# Patient Record
Sex: Male | Born: 1940 | Race: White | Hispanic: No | Marital: Married | Smoking: Current every day smoker
Health system: Southern US, Community
[De-identification: ages and names within clinical notes are randomized; demographics above are authoritative.]

## PROBLEM LIST (undated history)

## (undated) DIAGNOSIS — K589 Irritable bowel syndrome without diarrhea: Secondary | ICD-10-CM

## (undated) DIAGNOSIS — I10 Essential (primary) hypertension: Secondary | ICD-10-CM

## (undated) DIAGNOSIS — I4891 Unspecified atrial fibrillation: Secondary | ICD-10-CM

## (undated) DIAGNOSIS — N400 Enlarged prostate without lower urinary tract symptoms: Secondary | ICD-10-CM

## (undated) DIAGNOSIS — E785 Hyperlipidemia, unspecified: Secondary | ICD-10-CM

## (undated) DIAGNOSIS — K219 Gastro-esophageal reflux disease without esophagitis: Secondary | ICD-10-CM

## (undated) DIAGNOSIS — C4492 Squamous cell carcinoma of skin, unspecified: Secondary | ICD-10-CM

## (undated) DIAGNOSIS — F419 Anxiety disorder, unspecified: Secondary | ICD-10-CM

## (undated) HISTORY — DX: Squamous cell carcinoma of skin, unspecified: C44.92

---

## 2015-01-22 DIAGNOSIS — Z85828 Personal history of other malignant neoplasm of skin: Secondary | ICD-10-CM

## 2015-01-22 HISTORY — DX: Personal history of other malignant neoplasm of skin: Z85.828

## 2015-01-22 HISTORY — PX: BASAL CELL CARCINOMA EXCISION: SHX1214

## 2016-01-28 DIAGNOSIS — Z85828 Personal history of other malignant neoplasm of skin: Secondary | ICD-10-CM

## 2016-01-28 HISTORY — DX: Personal history of other malignant neoplasm of skin: Z85.828

## 2016-04-17 DIAGNOSIS — K588 Other irritable bowel syndrome: Secondary | ICD-10-CM | POA: Insufficient documentation

## 2016-04-21 ENCOUNTER — Encounter: Payer: Self-pay | Admitting: *Deleted

## 2016-04-21 ENCOUNTER — Emergency Department: Payer: Medicare Other

## 2016-04-21 ENCOUNTER — Emergency Department
Admission: EM | Admit: 2016-04-21 | Discharge: 2016-04-21 | Disposition: A | Discharge status: Home / Self Care | Payer: Medicare Other | Attending: Emergency Medicine | Admitting: Emergency Medicine

## 2016-04-21 DIAGNOSIS — R51 Headache: Secondary | ICD-10-CM | POA: Diagnosis not present

## 2016-04-21 DIAGNOSIS — Z87891 Personal history of nicotine dependence: Secondary | ICD-10-CM | POA: Insufficient documentation

## 2016-04-21 DIAGNOSIS — R42 Dizziness and giddiness: Secondary | ICD-10-CM | POA: Insufficient documentation

## 2016-04-21 DIAGNOSIS — R079 Chest pain, unspecified: Secondary | ICD-10-CM | POA: Diagnosis not present

## 2016-04-21 DIAGNOSIS — R197 Diarrhea, unspecified: Secondary | ICD-10-CM | POA: Diagnosis not present

## 2016-04-21 DIAGNOSIS — R110 Nausea: Secondary | ICD-10-CM | POA: Diagnosis not present

## 2016-04-21 DIAGNOSIS — Z79899 Other long term (current) drug therapy: Secondary | ICD-10-CM | POA: Diagnosis not present

## 2016-04-21 DIAGNOSIS — R531 Weakness: Secondary | ICD-10-CM | POA: Insufficient documentation

## 2016-04-21 DIAGNOSIS — R0602 Shortness of breath: Secondary | ICD-10-CM | POA: Insufficient documentation

## 2016-04-21 HISTORY — DX: Irritable bowel syndrome without diarrhea: K58.9

## 2016-04-21 HISTORY — DX: Unspecified atrial fibrillation: I48.91

## 2016-04-21 HISTORY — DX: Anxiety disorder, unspecified: F41.9

## 2016-04-21 LAB — URINALYSIS, COMPLETE (UACMP) WITH MICROSCOPIC
BILIRUBIN URINE: NEGATIVE
Bacteria, UA: NONE SEEN
Glucose, UA: NEGATIVE mg/dL
Hgb urine dipstick: NEGATIVE
Ketones, ur: 20 mg/dL — AB
Leukocytes, UA: NEGATIVE
Nitrite: NEGATIVE
PH: 6 (ref 5.0–8.0)
Protein, ur: 30 mg/dL — AB
SPECIFIC GRAVITY, URINE: 1.024 (ref 1.005–1.030)
SQUAMOUS EPITHELIAL / LPF: NONE SEEN

## 2016-04-21 LAB — BASIC METABOLIC PANEL
Anion gap: 10 (ref 5–15)
BUN: 19 mg/dL (ref 6–20)
CALCIUM: 9 mg/dL (ref 8.9–10.3)
CO2: 28 mmol/L (ref 22–32)
CREATININE: 1.27 mg/dL — AB (ref 0.61–1.24)
Chloride: 99 mmol/L — ABNORMAL LOW (ref 101–111)
GFR calc non Af Amer: 54 mL/min — ABNORMAL LOW (ref >60)
Glucose, Bld: 148 mg/dL — ABNORMAL HIGH (ref 65–99)
Potassium: 3.3 mmol/L — ABNORMAL LOW (ref 3.5–5.1)
SODIUM: 137 mmol/L (ref 135–145)

## 2016-04-21 LAB — CBC
HCT: 49.8 % (ref 40.0–52.0)
Hemoglobin: 16.4 g/dL (ref 13.0–18.0)
MCH: 31 pg (ref 26.0–34.0)
MCHC: 33 g/dL (ref 32.0–36.0)
MCV: 94.1 fL (ref 80.0–100.0)
PLATELETS: 243 10*3/uL (ref 150–440)
RBC: 5.29 MIL/uL (ref 4.40–5.90)
RDW: 13.3 % (ref 11.5–14.5)
WBC: 11.6 10*3/uL — ABNORMAL HIGH (ref 3.8–10.6)

## 2016-04-21 LAB — TROPONIN I: Troponin I: <0.03 ng/mL (ref <0.03)

## 2016-04-21 MED ORDER — IPRATROPIUM-ALBUTEROL 0.5-2.5 (3) MG/3ML IN SOLN
3.0000 mL | Freq: Once | RESPIRATORY_TRACT | Status: AC
  Administered 2016-04-21: 3 mL via RESPIRATORY_TRACT
  Filled 2016-04-21: qty 3

## 2016-04-21 MED ORDER — ALBUTEROL SULFATE HFA 108 (90 BASE) MCG/ACT IN AERS: 2.0000 | INHALATION_SPRAY | Freq: Four times a day (QID) | RESPIRATORY_TRACT | 0 refills | Status: DC | PRN

## 2016-04-21 NOTE — Discharge Instructions (Signed)
Please seek medical attention for any high fevers, chest pain, shortness of breath, change in behavior, persistent vomiting, bloody stool or any other new or concerning symptoms.  

## 2016-04-21 NOTE — ED Provider Notes (Signed)
[facility] Emergency Department Provider Note   ____________________________________________   I have reviewed the triage vital signs and the nursing notes.   HISTORY  Chief Complaint Weakness   History limited by: Not Limited   HPI Jeremiah Bell is a 76 y.o. male who presents to the emergency department today because of concerns for episode of chest pain, increasing shortness breath. Patient states that he has been having shortness of breath for the past couple of weeks. It is worse with exertion. The patient has a history of heavy smoking however states he is cutting back. He did not have any chest pain until today. Apparently also became diaphoretic. In addition however the patient has a history of anxiety and recently had his site a medication change. He had not taken it this morning. States that normally when he doesn't take his anxiety medication he does start shaking and that did happen this morning as well. He denies any fevers.    Past Medical History:  Diagnosis Date  . A-fib ([place])   . Anxiety   . Irritable bowel     There are no active problems to display for this patient.   History reviewed. No pertinent surgical history.  Prior to Admission medications   Medication Sig Start Date End Date Taking? Authorizing Provider  dicyclomine (BENTYL) 10 MG capsule Take 10 mg by mouth 4 (four) times daily as needed. 04/10/16  Yes Historical Provider, MD  flecainide (TAMBOCOR) 50 MG tablet Take 50 mg by mouth 2 (two) times daily. 04/09/16  Yes Historical Provider, MD  omeprazole (PRILOSEC) 40 MG capsule Take 40 mg by mouth daily. 04/10/16  Yes Historical Provider, MD  tamsulosin (FLOMAX) 0.4 MG CAPS capsule Take 0.4 mg by mouth 2 (two) times daily. 04/10/16  Yes Historical Provider, MD    Allergies Erythromycin and Sulfa antibiotics  History reviewed. No pertinent family history.  Social History Social History  Substance Use Topics  . Smoking  status: Never Smoker  . Smokeless tobacco: Never Used  . Alcohol use No    Review of Systems  Constitutional: Negative for fever. Cardiovascular: Positive for chest pain. Respiratory: Positive for shortness of breath. Gastrointestinal: Negative for abdominal pain, vomiting and diarrhea. Neurological: Negative for headaches, focal weakness or numbness.  10-point ROS otherwise negative.  ____________________________________________   PHYSICAL EXAM:  VITAL SIGNS: ED Triage Vitals [04/21/16 1210]  Enc Vitals Group     BP (!) 148/96     Pulse Rate 68     Resp 18     Temp 98.4 F (36.9 C)     Temp Source Oral     SpO2 100 %     Weight 202 lb (91.6 kg)     Height 5\' 11"  (1.803 m)     Head Circumference      Peak Flow      Pain Score 8   Constitutional: Alert and oriented. Well appearing and in no distress. Eyes: Conjunctivae are normal. Normal extraocular movements. ENT   Head: Normocephalic and atraumatic.   Nose: No congestion/rhinnorhea.   Mouth/Throat: Mucous membranes are moist.   Neck: No stridor. Hematological/Lymphatic/Immunilogical: No cervical lymphadenopathy. Cardiovascular: Normal rate, regular rhythm.  No murmurs, rubs, or gallops.  Respiratory: Normal respiratory effort without tachypnea nor retractions. Breath sounds are clear and equal bilaterally. No wheezes/rales/rhonchi. Gastrointestinal: Soft and non tender. No rebound. No guarding.  Genitourinary: Deferred Musculoskeletal: Normal range of motion in all extremities. No lower extremity edema. Neurologic:  Normal speech and language.  No gross focal neurologic deficits are appreciated.  Skin:  Skin is warm, dry and intact. No rash noted. Psychiatric: Mood and affect are normal. Speech and behavior are normal. Patient exhibits appropriate insight and judgment.  ____________________________________________    LABS (pertinent positives/negatives)  Labs Reviewed  BASIC METABOLIC PANEL -  Abnormal; Notable for the following:       Result Value   Potassium 3.3 (*)    Chloride 99 (*)    Glucose, Bld 148 (*)    Creatinine, Ser 1.27 (*)    GFR calc non Af Amer 54 (*)    All other components within normal limits  CBC - Abnormal; Notable for the following:    WBC 11.6 (*)    All other components within normal limits  URINALYSIS, COMPLETE (UACMP) WITH MICROSCOPIC - Abnormal; Notable for the following:    Color, Urine YELLOW (*)    APPearance CLEAR (*)    Ketones, ur 20 (*)    Protein, ur 30 (*)    All other components within normal limits  TROPONIN I  TROPONIN I  CBG MONITORING, ED     ____________________________________________   EKG  I, Nance Pear, attending physician, personally viewed and interpreted this EKG  EKG Time: 1202 Rate: 69 Rhythm: normal sinus rhythm Axis: normal Intervals: qtc 443 QRS: narrow ST changes: no st elevation Impression: normal ekg   ____________________________________________    RADIOLOGY  CXR IMPRESSION:  No acute disease.   ____________________________________________   PROCEDURES  Procedures  ____________________________________________   INITIAL IMPRESSION / ASSESSMENT AND PLAN / ED COURSE  Pertinent labs & imaging results that were available during my care of the patient were reviewed by me and considered in my medical decision making (see chart for details).  Patient presented to the emergency department today with episode of chest pain as well as shortness breath is been going on for the past couple of weeks. The patient had 2 troponins which were negative here. He also already has cardiology follow-up scheduled in 2 days. In addition he did get some relief from a DuoNeb. Does have a long history of smoking when he started manifest some COPD. Discussed return precautions.  ____________________________________________   FINAL CLINICAL IMPRESSION(S) / ED DIAGNOSES  Final diagnoses:  Shortness of  breath     Note: This dictation was prepared with Dragon dictation. Any transcriptional errors that result from this process are unintentional     Nance Pear, MD 04/21/16 1935

## 2016-04-21 NOTE — ED Notes (Signed)
Spoke with Dr. Jimmye Norman concerning pt, stated it would be okay for pt to take home xanax dose in lobby

## 2016-04-21 NOTE — ED Notes (Signed)
Patient transported to X-ray 

## 2016-04-21 NOTE — ED Triage Notes (Addendum)
Pt arrives with complaints of mid chest pain, headache, diarrhea and weakness for 3 days, pt sweating in triage, son with pt, pt shaking, states hx of anxiety and unsure if he has taken his xanax today, states 2 weeks ago they changed his xanax dosage, son yelled in lobby "he is in bad shape, if something happens to him I am going to sue"

## 2016-04-21 NOTE — ED Notes (Signed)
Pt presents with sob x 2 weeks, as well as sudden onset nausea, diarrhea x 3, dizziness, weakness today at 75 am. Son states that diarrhea is common for him.

## 2016-11-05 DIAGNOSIS — G4733 Obstructive sleep apnea (adult) (pediatric): Secondary | ICD-10-CM | POA: Insufficient documentation

## 2016-11-06 ENCOUNTER — Other Ambulatory Visit: Payer: Self-pay | Admitting: Cardiology

## 2016-11-06 DIAGNOSIS — R600 Localized edema: Secondary | ICD-10-CM

## 2016-11-10 ENCOUNTER — Ambulatory Visit
Admission: RE | Admit: 2016-11-10 | Discharge: 2016-11-10 | Disposition: A | Discharge status: Home / Self Care | Payer: Medicare Other | Source: Ambulatory Visit | Attending: Cardiology | Admitting: Cardiology

## 2016-11-10 DIAGNOSIS — R600 Localized edema: Secondary | ICD-10-CM | POA: Insufficient documentation

## 2017-12-14 ENCOUNTER — Telehealth: Payer: Self-pay | Admitting: *Deleted

## 2017-12-14 DIAGNOSIS — Z122 Encounter for screening for malignant neoplasm of respiratory organs: Secondary | ICD-10-CM

## 2017-12-14 DIAGNOSIS — Z87891 Personal history of nicotine dependence: Secondary | ICD-10-CM

## 2017-12-14 NOTE — Telephone Encounter (Signed)
Received referral for initial lung cancer screening scan. Contacted patient and obtained smoking history,(current, 57 pack year) as well as answering questions related to screening process. Patient denies signs of lung cancer such as weight loss or hemoptysis. Patient denies comorbidity that would prevent curative treatment if lung cancer were found. Patient is scheduled for shared decision making visit and CT scan on 12/31/17 at 145pm.

## 2017-12-30 ENCOUNTER — Telehealth: Payer: Self-pay | Admitting: *Deleted

## 2017-12-30 NOTE — Telephone Encounter (Signed)
Called pt to remind them of appt for tomorrow at 1345 for LDCT screening, message left.

## 2017-12-31 ENCOUNTER — Ambulatory Visit
Admission: RE | Admit: 2017-12-31 | Discharge: 2017-12-31 | Disposition: A | Discharge status: Home / Self Care | Payer: Medicare Other | Source: Ambulatory Visit | Attending: Nurse Practitioner | Admitting: Nurse Practitioner

## 2017-12-31 ENCOUNTER — Inpatient Hospital Stay: Payer: Medicare Other | Attending: Nurse Practitioner | Admitting: Nurse Practitioner

## 2017-12-31 ENCOUNTER — Encounter: Payer: Self-pay | Admitting: Nurse Practitioner

## 2017-12-31 DIAGNOSIS — Z87891 Personal history of nicotine dependence: Secondary | ICD-10-CM | POA: Insufficient documentation

## 2017-12-31 DIAGNOSIS — Z122 Encounter for screening for malignant neoplasm of respiratory organs: Secondary | ICD-10-CM | POA: Insufficient documentation

## 2017-12-31 NOTE — Progress Notes (Signed)
In accordance with CMS guidelines, patient has met eligibility criteria including age, absence of signs or symptoms of lung cancer.  Social History   Tobacco Use  . Smoking status: Current Every Day Smoker    Packs/day: 1.00    Years: 57.00    Pack years: 57.00  . Smokeless tobacco: Never Used  . Tobacco comment: currently smokes .5ppd  Substance Use Topics  . Alcohol use: No  . Drug use: Not on file      A shared decision-making session was conducted prior to the performance of CT scan. This includes one or more decision aids, includes benefits and harms of screening, follow-up diagnostic testing, over-diagnosis, false positive rate, and total radiation exposure.   Counseling on the importance of adherence to annual lung cancer LDCT screening, impact of co-morbidities, and ability or willingness to undergo diagnosis and treatment is imperative for compliance of the program.   Counseling on the importance of continued smoking cessation for former smokers; the importance of smoking cessation for current smokers, and information about tobacco cessation interventions have been given to patient including [place] and 1800 quit [place] programs.   Written order for lung cancer screening with LDCT has been given to the patient and any and all questions have been answered to the best of my abilities.    Yearly follow up will be coordinated by Burgess Estelle, Thoracic Navigator.  Beckey Rutter, DNP, AGNP-C [place] [facility] [phone] (work cell) [phone] (office)

## 2018-01-04 ENCOUNTER — Encounter: Payer: Self-pay | Admitting: *Deleted

## 2018-01-11 ENCOUNTER — Other Ambulatory Visit: Payer: Self-pay | Admitting: Internal Medicine

## 2018-01-11 DIAGNOSIS — R600 Localized edema: Secondary | ICD-10-CM

## 2018-01-11 DIAGNOSIS — R1031 Right lower quadrant pain: Secondary | ICD-10-CM

## 2018-01-20 ENCOUNTER — Ambulatory Visit
Admission: RE | Admit: 2018-01-20 | Discharge: 2018-01-20 | Disposition: A | Discharge status: Home / Self Care | Payer: Medicare Other | Source: Ambulatory Visit | Attending: Internal Medicine | Admitting: Internal Medicine

## 2018-01-20 DIAGNOSIS — R1031 Right lower quadrant pain: Secondary | ICD-10-CM

## 2018-01-20 DIAGNOSIS — R600 Localized edema: Secondary | ICD-10-CM | POA: Diagnosis present

## 2018-01-25 ENCOUNTER — Other Ambulatory Visit: Payer: Self-pay | Admitting: Internal Medicine

## 2018-01-25 ENCOUNTER — Encounter: Payer: Self-pay | Admitting: *Deleted

## 2018-01-25 DIAGNOSIS — N281 Cyst of kidney, acquired: Secondary | ICD-10-CM

## 2018-01-26 ENCOUNTER — Ambulatory Visit: Payer: Medicare Other | Admitting: Certified Registered Nurse Anesthetist

## 2018-01-26 ENCOUNTER — Encounter
Admission: RE | Disposition: A | Discharge status: Home / Self Care | Payer: Self-pay | Source: Home / Self Care | Attending: Internal Medicine

## 2018-01-26 ENCOUNTER — Encounter: Payer: Self-pay | Admitting: Certified Registered Nurse Anesthetist

## 2018-01-26 ENCOUNTER — Ambulatory Visit
Admission: RE | Admit: 2018-01-26 | Discharge: 2018-01-26 | Disposition: A | Discharge status: Home / Self Care | Payer: Medicare Other | Attending: Internal Medicine | Admitting: Internal Medicine

## 2018-01-26 DIAGNOSIS — K219 Gastro-esophageal reflux disease without esophagitis: Secondary | ICD-10-CM | POA: Diagnosis not present

## 2018-01-26 DIAGNOSIS — Z7901 Long term (current) use of anticoagulants: Secondary | ICD-10-CM | POA: Diagnosis not present

## 2018-01-26 DIAGNOSIS — E785 Hyperlipidemia, unspecified: Secondary | ICD-10-CM | POA: Diagnosis not present

## 2018-01-26 DIAGNOSIS — D122 Benign neoplasm of ascending colon: Secondary | ICD-10-CM | POA: Insufficient documentation

## 2018-01-26 DIAGNOSIS — Z09 Encounter for follow-up examination after completed treatment for conditions other than malignant neoplasm: Secondary | ICD-10-CM | POA: Insufficient documentation

## 2018-01-26 DIAGNOSIS — Z86010 Personal history of colonic polyps: Secondary | ICD-10-CM | POA: Insufficient documentation

## 2018-01-26 DIAGNOSIS — K589 Irritable bowel syndrome without diarrhea: Secondary | ICD-10-CM | POA: Insufficient documentation

## 2018-01-26 DIAGNOSIS — I4891 Unspecified atrial fibrillation: Secondary | ICD-10-CM | POA: Insufficient documentation

## 2018-01-26 DIAGNOSIS — Z79899 Other long term (current) drug therapy: Secondary | ICD-10-CM | POA: Insufficient documentation

## 2018-01-26 DIAGNOSIS — I10 Essential (primary) hypertension: Secondary | ICD-10-CM | POA: Insufficient documentation

## 2018-01-26 DIAGNOSIS — N400 Enlarged prostate without lower urinary tract symptoms: Secondary | ICD-10-CM | POA: Diagnosis not present

## 2018-01-26 DIAGNOSIS — D120 Benign neoplasm of cecum: Secondary | ICD-10-CM | POA: Insufficient documentation

## 2018-01-26 DIAGNOSIS — K640 First degree hemorrhoids: Secondary | ICD-10-CM | POA: Insufficient documentation

## 2018-01-26 DIAGNOSIS — F17200 Nicotine dependence, unspecified, uncomplicated: Secondary | ICD-10-CM | POA: Diagnosis not present

## 2018-01-26 DIAGNOSIS — F419 Anxiety disorder, unspecified: Secondary | ICD-10-CM | POA: Insufficient documentation

## 2018-01-26 HISTORY — DX: Gastro-esophageal reflux disease without esophagitis: K21.9

## 2018-01-26 HISTORY — DX: Benign prostatic hyperplasia without lower urinary tract symptoms: N40.0

## 2018-01-26 HISTORY — DX: Hyperlipidemia, unspecified: E78.5

## 2018-01-26 HISTORY — PX: COLONOSCOPY WITH PROPOFOL: SHX5780

## 2018-01-26 HISTORY — DX: Essential (primary) hypertension: I10

## 2018-01-26 SURGERY — COLONOSCOPY WITH PROPOFOL
Anesthesia: General

## 2018-01-26 MED ORDER — PROPOFOL 10 MG/ML IV BOLUS
INTRAVENOUS | Status: DC | PRN
  Administered 2018-01-26: 30 mg via INTRAVENOUS
  Administered 2018-01-26: 70 mg via INTRAVENOUS

## 2018-01-26 MED ORDER — LIDOCAINE HCL (CARDIAC) PF 100 MG/5ML IV SOSY
PREFILLED_SYRINGE | INTRAVENOUS | Status: DC | PRN
  Administered 2018-01-26: 50 mg via INTRAVENOUS

## 2018-01-26 MED ORDER — PROPOFOL 500 MG/50ML IV EMUL
INTRAVENOUS | Status: AC
  Filled 2018-01-26: qty 50

## 2018-01-26 MED ORDER — SODIUM CHLORIDE 0.9 % IV SOLN
INTRAVENOUS | Status: DC
  Administered 2018-01-26: 1000 mL via INTRAVENOUS

## 2018-01-26 MED ORDER — PROPOFOL 500 MG/50ML IV EMUL
INTRAVENOUS | Status: DC | PRN
  Administered 2018-01-26: 175 ug/kg/min via INTRAVENOUS

## 2018-01-26 MED ORDER — PHENYLEPHRINE HCL 10 MG/ML IJ SOLN
INTRAMUSCULAR | Status: DC | PRN
  Administered 2018-01-26: 200 ug via INTRAVENOUS

## 2018-01-26 NOTE — Interval H&P Note (Signed)
History and Physical Interval Note:  01/26/2018 10:47 AM  Jeremiah Bell  has presented today for surgery, with the diagnosis of PERSONAL HX.OF COLON POLYPS  The various methods of treatment have been discussed with the patient and family. After consideration of risks, benefits and other options for treatment, the patient has consented to  Procedure(s): COLONOSCOPY WITH PROPOFOL (N/A) as a surgical intervention .  The patient's history has been reviewed, patient examined, no change in status, stable for surgery.  I have reviewed the patient's chart and labs.  Questions were answered to the patient's satisfaction.     [place], [place]

## 2018-01-26 NOTE — Anesthesia Post-op Follow-up Note (Signed)
Anesthesia QCDR form completed.        

## 2018-01-26 NOTE — Anesthesia Procedure Notes (Signed)
Date/Time: 01/26/2018 11:02 AM Performed by: Johnna Acosta, CRNA Pre-anesthesia Checklist: Patient identified, Emergency Drugs available, Suction available, Patient being monitored and Timeout performed Patient Re-evaluated:Patient Re-evaluated prior to induction Oxygen Delivery Method: Nasal cannula Preoxygenation: Pre-oxygenation with 100% oxygen Induction Type: IV induction

## 2018-01-26 NOTE — Anesthesia Preprocedure Evaluation (Signed)
Anesthesia Evaluation  Patient identified by MRN, date of birth, ID band Patient awake    Reviewed: Allergy & Precautions, H&P , NPO status , Patient's Chart, lab work & pertinent test results, reviewed documented beta blocker date and time   Airway Mallampati: II   Neck ROM: full    Dental  (+) Poor Dentition, Teeth Intact   Pulmonary neg pulmonary ROS, Current Smoker,    Pulmonary exam normal        Cardiovascular Exercise Tolerance: Poor hypertension, On Medications negative cardio ROS Normal cardiovascular exam Rhythm:regular Rate:Normal     Neuro/Psych Anxiety negative neurological ROS  negative psych ROS   GI/Hepatic Neg liver ROS, GERD  Medicated,  Endo/Other  negative endocrine ROS  Renal/GU negative Renal ROS  negative genitourinary   Musculoskeletal   Abdominal   Peds  Hematology negative hematology ROS (+)   Anesthesia Other Findings Past Medical History: No date: A-fib ([place]) No date: Anxiety No date: BPH (benign prostatic hyperplasia) No date: GERD (gastroesophageal reflux disease) No date: Hyperlipidemia No date: Hypertension No date: Irritable bowel History reviewed. No pertinent surgical history. BMI    Body Mass Index:  29.01 kg/m     Reproductive/Obstetrics negative OB ROS                             Anesthesia Physical Anesthesia Plan  ASA: III  Anesthesia Plan: General   Post-op Pain Management:    Induction:   PONV Risk Score and Plan:   Airway Management Planned:   Additional Equipment:   Intra-op Plan:   Post-operative Plan:   Informed Consent: I have reviewed the patients History and Physical, chart, labs and discussed the procedure including the risks, benefits and alternatives for the proposed anesthesia with the patient or authorized representative who has indicated his/her understanding and acceptance.     Dental Advisory  Given  Plan Discussed with: CRNA  Anesthesia Plan Comments:         Anesthesia Quick Evaluation

## 2018-01-26 NOTE — Transfer of Care (Signed)
Immediate Anesthesia Transfer of Care Note  Patient: Jeremiah Bell  Procedure(s) Performed: COLONOSCOPY WITH PROPOFOL (N/A )  Patient Location: Endoscopy Unit  Anesthesia Type:General  Level of Consciousness: drowsy and patient cooperative  Airway & Oxygen Therapy: Patient Spontanous Breathing and Patient connected to nasal cannula oxygen  Post-op Assessment: Report given to RN and Post -op Vital signs reviewed and stable  Post vital signs: Reviewed  Last Vitals:  Vitals Value Taken Time  BP    Temp    Pulse 62 01/26/2018 11:27 AM  Resp 16 01/26/2018 11:27 AM  SpO2      Last Pain:  Vitals:   01/26/18 0949  TempSrc: Tympanic  PainSc: 0-No pain         Complications: No apparent anesthesia complications

## 2018-01-26 NOTE — Op Note (Signed)
[facility] Gastroenterology Patient Name: Jeremiah Bell Procedure Date: 01/26/2018 10:51 AM MRN: [id number] Account #: [id number] Date of Birth: [birth year] Admit Type: Outpatient Age: 78 Room: [facility] ENDO ROOM 2 Gender: Male Note Status: Finalized Procedure:            Colonoscopy Indications:          High risk colon cancer surveillance: Personal history                        of colonic polyps Providers:            Benay Pike. Alice Reichert MD, MD Referring MD:         Tracie Harrier, MD (Referring MD) Medicines:            Propofol per Anesthesia Complications:        No immediate complications. Procedure:            Pre-Anesthesia Assessment:                       - The risks and benefits of the procedure and the                        sedation options and risks were discussed with the                        patient. All questions were answered and informed                        consent was obtained.                       - Patient identification and proposed procedure were                        verified prior to the procedure by the nurse. The                        procedure was verified in the procedure room.                       - ASA Grade Assessment: III - A patient with severe                        systemic disease.                       - After reviewing the risks and benefits, the patient                        was deemed in satisfactory condition to undergo the                        procedure.                       After obtaining informed consent, the colonoscope was                        passed under direct vision. Throughout the procedure,  the patient's blood pressure, pulse, and oxygen                        saturations were monitored continuously. The                        Colonoscope was introduced through the anus and                        advanced to the the cecum, identified by appendiceal   orifice and ileocecal valve. The colonoscopy was                        performed without difficulty. The patient tolerated the                        procedure well. The quality of the bowel preparation                        was excellent. The ileocecal valve, appendiceal                        orifice, and rectum were photographed. Findings:      The perianal and digital rectal examinations were normal. Pertinent       negatives include normal sphincter tone and no palpable rectal lesions.      Five sessile polyps were found in the ascending colon and cecum. The       polyps were 5 to 8 mm in size. These polyps were removed with a cold       snare. Resection and retrieval were complete.      A 4 mm polyp was found in the ascending colon. The polyp was sessile.       The polyp was removed with a jumbo cold forceps. Resection and retrieval       were complete.      Non-bleeding internal hemorrhoids were found during retroflexion. The       hemorrhoids were Grade I (internal hemorrhoids that do not prolapse).      The exam was otherwise without abnormality. Impression:           - Five 5 to 8 mm polyps in the ascending colon and in                        the cecum, removed with a cold snare. Resected and                        retrieved.                       - One 4 mm polyp in the ascending colon, removed with a                        jumbo cold forceps. Resected and retrieved.                       - Non-bleeding internal hemorrhoids.                       - The examination was otherwise normal. Recommendation:       - Patient has a contact number available  for                        emergencies. The signs and symptoms of potential                        delayed complications were discussed with the patient.                        Return to normal activities tomorrow. Written discharge                        instructions were provided to the patient.                       - Resume  previous diet.                       - Continue present medications.                       - No repeat colonoscopy due to age.                       - Return to GI office.                       - Return to GI office PRN. Procedure Code(s):    --- Professional ---                       [id number], Colonoscopy, flexible; with removal of tumor(s),                        polyp(s), or other lesion(s) by snare technique                       45380, 34, Colonoscopy, flexible; with biopsy, single                        or multiple Diagnosis Code(s):    --- Professional ---                       K64.0, First degree hemorrhoids                       D12.2, Benign neoplasm of ascending colon                       D12.0, Benign neoplasm of cecum                       Z86.010, Personal history of colonic polyps CPT copyright 2018 American Medical Association. All rights reserved. The codes documented in this report are preliminary and upon coder review may  be revised to meet current compliance requirements. Efrain Sella MD, MD 01/26/2018 11:26:37 AM This report has been signed electronically. Number of Addenda: 0 Note Initiated On: 01/26/2018 10:51 AM Scope Withdrawal Time: 0 hours 13 minutes 21 seconds  Total Procedure Duration: 0 hours 15 minutes 39 seconds       [facility]

## 2018-01-26 NOTE — H&P (Signed)
Outpatient short stay form Pre-procedure 01/26/2018 10:45 AM Jeremiah Bell K. Alice Reichert, M.D.  Primary Physician: Tracie Harrier, M.D.  Reason for visit:  Personal hx of colon polyps  History of present illness:                            Patient presents for colonoscopy for a personal hx of colon polyps. The patient denies abdominal pain, abnormal weight loss or rectal bleeding. Held warfarin x 5 days prior to procedure which he takes for atrial fibrillation.    Current Facility-Administered Medications:  .  0.9 %  sodium chloride infusion, , Intravenous, Continuous, [place], Benay Pike, MD, Last Rate: 20 mL/hr at 01/26/18 1001  Medications Prior to Admission  Medication Sig Dispense Refill Last Dose  . albuterol (PROVENTIL HFA;VENTOLIN HFA) 108 (90 Base) MCG/ACT inhaler Inhale 2 puffs into the lungs every 6 (six) hours as needed for wheezing or shortness of breath. 1 Inhaler 0 Past Week at Unknown time  . ALPRAZolam (XANAX) 1 MG tablet Take 1 mg by mouth 2 (two) times daily as needed for anxiety.   Past Week at Unknown time  . dicyclomine (BENTYL) 10 MG capsule Take 10 mg by mouth 4 (four) times daily as needed.  0 Past Week at Unknown time  . flecainide (TAMBOCOR) 50 MG tablet Take 50 mg by mouth 2 (two) times daily.  2 Past Week at Unknown time  . omeprazole (PRILOSEC) 40 MG capsule Take 40 mg by mouth daily.  0 Past Week at Unknown time  . senna-docusate (SENOKOT-S) 8.6-50 MG tablet Take 1 tablet by mouth 2 (two) times daily.   Past Week at Unknown time  . simvastatin (ZOCOR) 40 MG tablet Take 40 mg by mouth daily.   Past Week at Unknown time  . tamsulosin (FLOMAX) 0.4 MG CAPS capsule Take 0.4 mg by mouth 2 (two) times daily.  0 01/25/2018 at Unknown time  . torsemide (DEMADEX) 10 MG tablet Take 30 mg by mouth daily.   Past Week at Unknown time  . traZODone (DESYREL) 50 MG tablet Take 50 mg by mouth at bedtime.   Past Week at Unknown time  . vitamin B-12 (CYANOCOBALAMIN) 1000 MCG tablet Take  1,000 mcg by mouth daily.   Past Week at Unknown time  . warfarin (COUMADIN) 1 MG tablet Take 1 mg by mouth daily.   Past Week at Unknown time  . warfarin (COUMADIN) 5 MG tablet Take 5 mg by mouth daily.   Past Week at Unknown time     Allergies  Allergen Reactions  . Erythromycin   . Sulfa Antibiotics   . Terramycin [Oxytetracycline] Other (See Comments)     Past Medical History:  Diagnosis Date  . A-fib ([place])   . Anxiety   . BPH (benign prostatic hyperplasia)   . GERD (gastroesophageal reflux disease)   . Hyperlipidemia   . Hypertension   . Irritable bowel     Review of systems:  Otherwise negative.    Physical Exam  Gen: Alert, oriented. Appears stated age.  HEENT: [place]/AT. PERRLA. Lungs: CTA, no wheezes. CV: RR nl S1, S2. Abd: soft, benign, no masses. BS+ Ext: No edema. Pulses 2+    Planned procedures: Proceed with colonoscopy. The patient understands the nature of the planned procedure, indications, risks, alternatives and potential complications including but not limited to bleeding, infection, perforation, damage to internal organs and possible oversedation/side effects from anesthesia. The patient agrees and gives consent  to proceed.  Please refer to procedure notes for findings, recommendations and patient disposition/instructions.     Teagyn Fishel K. Alice Reichert, M.D. Gastroenterology 01/26/2018  10:45 AM

## 2018-01-26 NOTE — Anesthesia Postprocedure Evaluation (Signed)
Anesthesia Post Note  Patient: Jeremiah Bell  Procedure(s) Performed: COLONOSCOPY WITH PROPOFOL (N/A )  Patient location during evaluation: Endoscopy Anesthesia Type: General Level of consciousness: awake and alert and oriented Pain management: pain level controlled Vital Signs Assessment: post-procedure vital signs reviewed and stable Respiratory status: spontaneous breathing, nonlabored ventilation and respiratory function stable Cardiovascular status: blood pressure returned to baseline and stable Postop Assessment: no signs of nausea or vomiting Anesthetic complications: no     Last Vitals:  Vitals:   01/26/18 1146 01/26/18 1156  BP: 97/68 120/69  Pulse: (!) 53 73  Resp: 14 14  Temp:    SpO2: 98% 98%    Last Pain:  Vitals:   01/26/18 1156  TempSrc:   PainSc: 0-No pain                 Gala Padovano

## 2018-01-27 ENCOUNTER — Encounter: Payer: Self-pay | Admitting: Internal Medicine

## 2018-01-27 LAB — SURGICAL PATHOLOGY

## 2018-02-01 ENCOUNTER — Ambulatory Visit
Admission: RE | Admit: 2018-02-01 | Discharge: 2018-02-01 | Disposition: A | Discharge status: Home / Self Care | Payer: Medicare Other | Source: Ambulatory Visit | Attending: Internal Medicine | Admitting: Internal Medicine

## 2018-02-01 DIAGNOSIS — N281 Cyst of kidney, acquired: Secondary | ICD-10-CM

## 2018-02-01 MED ORDER — IOPAMIDOL (ISOVUE-300) INJECTION 61%
100.0000 mL | Freq: Once | INTRAVENOUS | Status: AC | PRN
  Administered 2018-02-01: 100 mL via INTRAVENOUS

## 2018-03-12 ENCOUNTER — Other Ambulatory Visit: Payer: Self-pay | Admitting: Internal Medicine

## 2018-03-12 DIAGNOSIS — R911 Solitary pulmonary nodule: Secondary | ICD-10-CM

## 2018-03-16 ENCOUNTER — Emergency Department
Admission: EM | Admit: 2018-03-16 | Discharge: 2018-03-17 | Disposition: A | Discharge status: Home / Self Care | Payer: Medicare Other | Attending: Emergency Medicine | Admitting: Emergency Medicine

## 2018-03-16 ENCOUNTER — Other Ambulatory Visit: Payer: Self-pay

## 2018-03-16 ENCOUNTER — Emergency Department: Payer: Medicare Other

## 2018-03-16 DIAGNOSIS — E876 Hypokalemia: Secondary | ICD-10-CM | POA: Diagnosis not present

## 2018-03-16 DIAGNOSIS — R202 Paresthesia of skin: Secondary | ICD-10-CM | POA: Diagnosis present

## 2018-03-16 DIAGNOSIS — Z79899 Other long term (current) drug therapy: Secondary | ICD-10-CM | POA: Diagnosis not present

## 2018-03-16 DIAGNOSIS — Z7901 Long term (current) use of anticoagulants: Secondary | ICD-10-CM | POA: Diagnosis not present

## 2018-03-16 DIAGNOSIS — I10 Essential (primary) hypertension: Secondary | ICD-10-CM | POA: Diagnosis not present

## 2018-03-16 DIAGNOSIS — F17210 Nicotine dependence, cigarettes, uncomplicated: Secondary | ICD-10-CM | POA: Insufficient documentation

## 2018-03-16 LAB — CBC
HCT: 47.9 % (ref 39.0–52.0)
Hemoglobin: 16.8 g/dL (ref 13.0–17.0)
MCH: 32 pg (ref 26.0–34.0)
MCHC: 35.1 g/dL (ref 30.0–36.0)
MCV: 91.2 fL (ref 80.0–100.0)
Platelets: 275 10*3/uL (ref 150–400)
RBC: 5.25 MIL/uL (ref 4.22–5.81)
RDW: 11.9 % (ref 11.5–15.5)
WBC: 11.7 10*3/uL — ABNORMAL HIGH (ref 4.0–10.5)
nRBC: 0 % (ref 0.0–0.2)

## 2018-03-16 LAB — COMPREHENSIVE METABOLIC PANEL
ALT: 22 U/L (ref 0–44)
AST: 25 U/L (ref 15–41)
Albumin: 4.3 g/dL (ref 3.5–5.0)
Alkaline Phosphatase: 44 U/L (ref 38–126)
Anion gap: 13 (ref 5–15)
BUN: 22 mg/dL (ref 8–23)
CO2: 27 mmol/L (ref 22–32)
Calcium: 9.2 mg/dL (ref 8.9–10.3)
Chloride: 98 mmol/L (ref 98–111)
Creatinine, Ser: 1.5 mg/dL — ABNORMAL HIGH (ref 0.61–1.24)
GFR calc Af Amer: 52 mL/min — ABNORMAL LOW (ref >60)
GFR calc non Af Amer: 45 mL/min — ABNORMAL LOW (ref >60)
Glucose, Bld: 171 mg/dL — ABNORMAL HIGH (ref 70–99)
Potassium: 2.5 mmol/L — CL (ref 3.5–5.1)
Sodium: 138 mmol/L (ref 135–145)
Total Bilirubin: 0.7 mg/dL (ref 0.3–1.2)
Total Protein: 7.2 g/dL (ref 6.5–8.1)

## 2018-03-16 LAB — TROPONIN I

## 2018-03-16 LAB — PROTIME-INR
INR: 1.4 — ABNORMAL HIGH (ref 0.8–1.2)
Prothrombin Time: 17.1 seconds — ABNORMAL HIGH (ref 11.4–15.2)

## 2018-03-16 NOTE — ED Triage Notes (Addendum)
Pt in with co tingling to all extremities states has had it for a week states tonight became worse to lower legs bilat. States also co shob and congestion for over a week.

## 2018-03-16 NOTE — ED Provider Notes (Addendum)
[facility] Emergency Department Provider Note    First MD Initiated Contact with Patient 03/16/18 2336     (approximate)  I have reviewed the triage vital signs and the nursing notes.   HISTORY  Chief Complaint Tingling   HPI Jeremiah Bell is a 78 y.o. male with below list of chronic medical conditions including hypertension hyperlipidemia atrial fibrillation presents to the emergency department with 1-week history of tingling and discomfort to all extremities.  Patient denies any chest pain no shortness of breath.  Patient denies any fever.  Patient states that tingling and discomfort has progressively worsened since onset.       Past Medical History:  Diagnosis Date  . A-fib ([place])   . Anxiety   . BPH (benign prostatic hyperplasia)   . GERD (gastroesophageal reflux disease)   . Hyperlipidemia   . Hypertension   . Irritable bowel     Patient Active Problem List   Diagnosis Date Noted  . Personal history of tobacco use, presenting hazards to health 12/31/2017    Past Surgical History:  Procedure Laterality Date  . COLONOSCOPY WITH PROPOFOL N/A 01/26/2018   Procedure: COLONOSCOPY WITH PROPOFOL;  Surgeon: Toledo, Benay Pike, MD;  Location: [facility] ENDOSCOPY;  Service: Gastroenterology;  Laterality: N/A;    Prior to Admission medications   Medication Sig Start Date End Date Taking? Authorizing Provider  albuterol (PROVENTIL HFA;VENTOLIN HFA) 108 (90 Base) MCG/ACT inhaler Inhale 2 puffs into the lungs every 6 (six) hours as needed for wheezing or shortness of breath. 04/21/16   Nance Pear, MD  ALPRAZolam Duanne Moron) 1 MG tablet Take 1 mg by mouth 2 (two) times daily as needed for anxiety.    [provider]  dicyclomine (BENTYL) 10 MG capsule Take 10 mg by mouth 4 (four) times daily as needed. 04/10/16   [provider]  flecainide (TAMBOCOR) 50 MG tablet Take 50 mg by mouth 2 (two) times daily. 04/09/16   [provider]   omeprazole (PRILOSEC) 40 MG capsule Take 40 mg by mouth daily. 04/10/16   [provider]  senna-docusate (SENOKOT-S) 8.6-50 MG tablet Take 1 tablet by mouth 2 (two) times daily.    [provider]  simvastatin (ZOCOR) 40 MG tablet Take 40 mg by mouth daily.    [provider]  tamsulosin (FLOMAX) 0.4 MG CAPS capsule Take 0.4 mg by mouth 2 (two) times daily. 04/10/16   [provider]  torsemide (DEMADEX) 10 MG tablet Take 30 mg by mouth daily.    [provider]  traZODone (DESYREL) 50 MG tablet Take 50 mg by mouth at bedtime.    [provider]  vitamin B-12 (CYANOCOBALAMIN) 1000 MCG tablet Take 1,000 mcg by mouth daily.    [provider]  warfarin (COUMADIN) 1 MG tablet Take 1 mg by mouth daily.    [provider]  warfarin (COUMADIN) 5 MG tablet Take 5 mg by mouth daily.    [provider]    Allergies Erythromycin; Sulfa antibiotics; and Terramycin [oxytetracycline]  No family history on file.  Social History Social History   Tobacco Use  . Smoking status: Current Every Day Smoker    Packs/day: 1.00    Years: 57.00    Pack years: 57.00    Types: Cigarettes  . Smokeless tobacco: Never Used  . Tobacco comment: currently smokes .5ppd  Substance Use Topics  . Alcohol use: Yes    Alcohol/week: 5.0 standard drinks  Types: 2 Glasses of wine, 3 Cans of beer per week    Comment: daily  . Drug use: Never    Review of Systems Constitutional: No fever/chills Eyes: No visual changes. ENT: No sore throat. Cardiovascular: Denies chest pain. Respiratory: Denies shortness of breath. Gastrointestinal: No abdominal pain.  No nausea, no vomiting.  No diarrhea.  No constipation. Genitourinary: Negative for dysuria. Musculoskeletal: Negative for neck pain.  Negative for back pain. Integumentary: Negative for rash. Neurological: Negative for headaches, focal weakness or numbness.  Positive for tingling to  all extremities   ____________________________________________   PHYSICAL EXAM:  VITAL SIGNS: ED Triage Vitals  Enc Vitals Group     BP 03/16/18 2129 130/85     Pulse Rate 03/16/18 2129 70     Resp 03/16/18 2129 (!) 24     Temp 03/16/18 2129 (!) 97.4 F (36.3 C)     Temp Source 03/16/18 2129 Oral     SpO2 03/16/18 2129 99 %     Weight 03/16/18 2130 9.526 kg (21 lb)     Height 03/16/18 2130 1.803 m (5\' 11" )     Head Circumference --      Peak Flow --      Pain Score 03/16/18 2130 8     Pain Loc --      Pain Edu? --      Excl. in [place]? --     Constitutional: Alert and oriented. Well appearing and in no acute distress. Eyes: Conjunctivae are normal. PERRL. EOMI. Mouth/Throat: Mucous membranes are moist. Oropharynx non-erythematous. Neck: No stridor. Cardiovascular: Normal rate, regular rhythm. Good peripheral circulation. Grossly normal heart sounds. Respiratory: Normal respiratory effort.  No retractions. Lungs CTAB. Gastrointestinal: Soft and nontender. No distention.  Musculoskeletal: No lower extremity tenderness nor edema. No gross deformities of extremities. Neurologic:  Normal speech and language. No gross focal neurologic deficits are appreciated.  Skin:  Skin is warm, dry and intact. No rash noted. Psychiatric: Mood and affect are normal. Speech and behavior are normal.  ____________________________________________   LABS (all labs ordered are listed, but only abnormal results are displayed)  Labs Reviewed  CBC - Abnormal; Notable for the following components:      Result Value   WBC 11.7 (*)    All other components within normal limits  COMPREHENSIVE METABOLIC PANEL - Abnormal; Notable for the following components:   Potassium 2.5 (*)    Glucose, Bld 171 (*)    Creatinine, Ser 1.50 (*)    GFR calc non Af Amer 45 (*)    GFR calc Af Amer 52 (*)    All other components within normal limits  PROTIME-INR - Abnormal; Notable for the following components:    Prothrombin Time 17.1 (*)    INR 1.4 (*)    All other components within normal limits  TROPONIN I   ____________________________________________  EKG  ED ECG REPORT I, [place] N , the attending physician, personally viewed and interpreted this ECG.   Date: 03/17/2018  EKG Time: 9:28 PM  Rate: 102  Rhythm: Atrial fibrillation rapid ventricular response  Axis: Normal  Intervals: Normal  ST&T Change: None  ____________________________________________  RADIOLOGY I, [place] Ernst Bowler, personally viewed and evaluated these images (plain radiographs) as part of my medical decision making, as well as reviewing the written report by the radiologist.  ED MD interpretation: No acute cardiopulmonary process noted on chest x-ray per radiologist.  Official radiology report(s): Dg Chest 2 View  Result Date: 03/16/2018 CLINICAL DATA:  Extremity tingling, shortness of breath and congestion for a week. EXAM: CHEST - 2 VIEW COMPARISON:  CT chest December 31, 2017 FINDINGS: Cardiomediastinal silhouette is normal. No pleural effusions or focal consolidations. Trachea projects midline and there is no pneumothorax. Soft tissue planes and included osseous structures are non-suspicious. Mild thoracic spondylosis. IMPRESSION: No acute cardiopulmonary process. Electronically Signed   By: Elon Alas M.D.   On: 03/16/2018 21:53     Procedures   ____________________________________________   INITIAL IMPRESSION / MDM / [place] / ED COURSE  As part of my medical decision making, I reviewed the following data within the electronic MEDICAL RECORD NUMBER  78 year old male presented with above-stated history and physical exam concerning for possible hypokalemia given history of furosemide.  Laboratory data revealed potassium of 2.5.  Patient given 40 mEq of potassium p.o. as well as 10 mEq of IV potassium x3 doses ordered.  _____________________________  FINAL CLINICAL IMPRESSION(S) / ED  DIAGNOSES  Final diagnoses:  None     MEDICATIONS GIVEN DURING THIS VISIT:  Medications - No data to display   ED Discharge Orders    None       Note:  This document was prepared using Dragon voice recognition software and may include unintentional dictation errors.   Gregor Hams, MD 03/17/18 [id number]    Gregor Hams, MD 03/17/18 [phone]

## 2018-03-17 DIAGNOSIS — E876 Hypokalemia: Secondary | ICD-10-CM | POA: Diagnosis not present

## 2018-03-17 MED ORDER — POTASSIUM CHLORIDE CRYS ER 20 MEQ PO TBCR: 40.0000 meq | EXTENDED_RELEASE_TABLET | Freq: Once | ORAL | Status: DC

## 2018-03-17 MED ORDER — POTASSIUM CHLORIDE CRYS ER 20 MEQ PO TBCR
40.0000 meq | EXTENDED_RELEASE_TABLET | Freq: Once | ORAL | Status: AC
  Administered 2018-03-17: 40 meq via ORAL
  Filled 2018-03-17: qty 2

## 2018-03-17 MED ORDER — POTASSIUM CHLORIDE 10 MEQ/100ML IV SOLN
10.0000 meq | INTRAVENOUS | Status: AC
  Administered 2018-03-17 (×3): 10 meq via INTRAVENOUS
  Filled 2018-03-17 (×3): qty 100

## 2018-04-02 ENCOUNTER — Other Ambulatory Visit: Payer: Self-pay

## 2018-04-02 ENCOUNTER — Encounter (INDEPENDENT_AMBULATORY_CARE_PROVIDER_SITE_OTHER): Payer: Self-pay | Admitting: Vascular Surgery

## 2018-04-02 ENCOUNTER — Other Ambulatory Visit (INDEPENDENT_AMBULATORY_CARE_PROVIDER_SITE_OTHER): Payer: Self-pay | Admitting: Vascular Surgery

## 2018-04-02 ENCOUNTER — Ambulatory Visit (INDEPENDENT_AMBULATORY_CARE_PROVIDER_SITE_OTHER): Payer: Medicare Other | Admitting: Vascular Surgery

## 2018-04-02 ENCOUNTER — Ambulatory Visit (INDEPENDENT_AMBULATORY_CARE_PROVIDER_SITE_OTHER): Payer: Medicare Other

## 2018-04-02 VITALS — BP 168/83 | HR 74 | Resp 16 | Ht 71.0 in | Wt 199.0 lb

## 2018-04-02 DIAGNOSIS — I739 Peripheral vascular disease, unspecified: Secondary | ICD-10-CM | POA: Diagnosis not present

## 2018-04-02 DIAGNOSIS — F17210 Nicotine dependence, cigarettes, uncomplicated: Secondary | ICD-10-CM

## 2018-04-02 DIAGNOSIS — I4891 Unspecified atrial fibrillation: Secondary | ICD-10-CM

## 2018-04-02 DIAGNOSIS — M79604 Pain in right leg: Secondary | ICD-10-CM | POA: Diagnosis not present

## 2018-04-02 DIAGNOSIS — E785 Hyperlipidemia, unspecified: Secondary | ICD-10-CM | POA: Diagnosis not present

## 2018-04-02 DIAGNOSIS — Z7901 Long term (current) use of anticoagulants: Secondary | ICD-10-CM

## 2018-04-02 DIAGNOSIS — I10 Essential (primary) hypertension: Secondary | ICD-10-CM

## 2018-04-02 DIAGNOSIS — M79605 Pain in left leg: Secondary | ICD-10-CM

## 2018-04-02 DIAGNOSIS — Z79899 Other long term (current) drug therapy: Secondary | ICD-10-CM

## 2018-04-02 DIAGNOSIS — M79609 Pain in unspecified limb: Secondary | ICD-10-CM | POA: Insufficient documentation

## 2018-04-02 NOTE — Patient Instructions (Signed)
Peripheral Vascular Disease  Peripheral vascular disease (PVD) is a disease of the blood vessels that are not part of your heart and brain. A simple term for PVD is poor circulation. In most cases, PVD narrows the blood vessels that carry blood from your heart to the rest of your body. This can reduce the supply of blood to your arms, legs, and internal organs, like your stomach or kidneys. However, PVD most often affects a person's lower legs and feet. Without treatment, PVD tends to get worse. PVD can also lead to acute ischemic limb. This is when an arm or leg suddenly cannot get enough blood. This is a medical emergency. Follow these instructions at home: Lifestyle  Do not use any products that contain nicotine or tobacco, such as cigarettes and e-cigarettes. If you need help quitting, ask your doctor.  Lose weight if you are overweight. Or, stay at a healthy weight as told by your doctor.  Eat a diet that is low in fat and cholesterol. If you need help, ask your doctor.  Exercise regularly. Ask your doctor for activities that are right for you. General instructions  Take over-the-counter and prescription medicines only as told by your doctor.  Take good care of your feet: ? Wear comfortable shoes that fit well. ? Check your feet often for any cuts or sores.  Keep all follow-up visits as told by your doctor This is important. Contact a doctor if:  You have cramps in your legs when you walk.  You have leg pain when you are at rest.  You have coldness in a leg or foot.  Your skin changes.  You are unable to get or have an erection (erectile dysfunction).  You have cuts or sores on your feet that do not heal. Get help right away if:  Your arm or leg turns cold, numb, and blue.  Your arms or legs become red, warm, swollen, painful, or numb.  You have chest pain.  You have trouble breathing.  You suddenly have weakness in your face, arm, or leg.  You become very  confused or you cannot speak.  You suddenly have a very bad headache.  You suddenly cannot see. Summary  Peripheral vascular disease (PVD) is a disease of the blood vessels.  A simple term for PVD is poor circulation. Without treatment, PVD tends to get worse.  Treatment may include exercise, low fat and low cholesterol diet, and quitting smoking. This information is not intended to replace advice given to you by your health care provider. Make sure you discuss any questions you have with your health care provider. Document Released: 03/26/2009 Document Revised: 02/07/2016 Document Reviewed: 02/07/2016 Elsevier Interactive Patient Education  2019 Elsevier Inc.  

## 2018-04-02 NOTE — Assessment & Plan Note (Signed)
blood pressure control important in reducing the progression of atherosclerotic disease. On appropriate oral medications.  

## 2018-04-02 NOTE — Assessment & Plan Note (Signed)
He is studied with noninvasive studies today which demonstrate normal triphasic waveforms and ABIs of 1.1 bilaterally with normal digital pressures consistent with no arterial insufficiency.  It does not appear that his lower extremity symptoms are related to arterial insufficiency.  No further vascular testing or evaluation is necessary at this time.  A higher dose of Neurontin may be of benefit.  I will see him back as needed.

## 2018-04-02 NOTE — Assessment & Plan Note (Signed)
lipid control important in reducing the progression of atherosclerotic disease. Continue statin therapy  

## 2018-04-02 NOTE — Progress Notes (Signed)
Patient ID: Jeremiah Bell, male   DOB: 1940-11-25, 78 y.o.   MRN: [id number]  Chief Complaint  Patient presents with  . New Patient (Initial Visit)    ref Troxler for pvd    HPI Jeremiah Bell is a 78 y.o. male.  I am asked to see the patient by Dr. Elvina Mattes for evaluation of his legs for PAD.  He complains of severe burning in his feet.  This happens all the time without worsening or alleviating with activity or certain positions.  This is been going on for many months.  He is on a low-dose of Neurontin which has not helped.  He has no previous history of vascular surgery or intervention to his lower extremities to his knowledge.  He denies ulceration or infection.  No signs of systemic infection such as fevers or chills.  He is studied with noninvasive studies today which demonstrate normal triphasic waveforms and ABIs of 1.1 bilaterally with normal digital pressures consistent with no arterial insufficiency.     Past Medical History:  Diagnosis Date  . A-fib ([place])   . Anxiety   . BPH (benign prostatic hyperplasia)   . GERD (gastroesophageal reflux disease)   . Hyperlipidemia   . Hypertension   . Irritable bowel     Past Surgical History:  Procedure Laterality Date  . COLONOSCOPY WITH PROPOFOL N/A 01/26/2018   Procedure: COLONOSCOPY WITH PROPOFOL;  Surgeon: Toledo, Benay Pike, MD;  Location: [facility] ENDOSCOPY;  Service: Gastroenterology;  Laterality: N/A;    Family History No history of bleeding disorders, clotting disorders, autoimmune diseases, aneurysms, or porphyria  Social History Social History   Tobacco Use  . Smoking status: Current Every Day Smoker    Packs/day: 1.00    Years: 57.00    Pack years: 57.00    Types: Cigarettes  . Smokeless tobacco: Never Used  . Tobacco comment: currently smokes .5ppd  Substance Use Topics  . Alcohol use: Yes    Alcohol/week: 5.0 standard drinks    Types: 2 Glasses of wine, 3 Cans of beer per week    Comment: daily  .  Drug use: Never    Allergies  Allergen Reactions  . Erythromycin   . Sulfa Antibiotics   . Terramycin [Oxytetracycline] Other (See Comments)    Current Outpatient Medications  Medication Sig Dispense Refill  . albuterol (PROVENTIL HFA;VENTOLIN HFA) 108 (90 Base) MCG/ACT inhaler Inhale 2 puffs into the lungs every 6 (six) hours as needed for wheezing or shortness of breath. 1 Inhaler 0  . ALPRAZolam (XANAX) 1 MG tablet Take 1 mg by mouth 2 (two) times daily as needed for anxiety.    . dicyclomine (BENTYL) 10 MG capsule Take 10 mg by mouth 4 (four) times daily as needed.  0  . flecainide (TAMBOCOR) 50 MG tablet Take 50 mg by mouth 2 (two) times daily.  2  . omeprazole (PRILOSEC) 40 MG capsule Take 40 mg by mouth daily.  0  . senna-docusate (SENOKOT-S) 8.6-50 MG tablet Take 1 tablet by mouth 2 (two) times daily.    . simvastatin (ZOCOR) 40 MG tablet Take 40 mg by mouth daily.    . tamsulosin (FLOMAX) 0.4 MG CAPS capsule Take 0.4 mg by mouth 2 (two) times daily.  0  . torsemide (DEMADEX) 10 MG tablet Take 30 mg by mouth daily.    . traZODone (DESYREL) 50 MG tablet Take 50 mg by mouth at bedtime.    . vitamin B-12 (CYANOCOBALAMIN)  1000 MCG tablet Take 1,000 mcg by mouth daily.    Marland Kitchen warfarin (COUMADIN) 1 MG tablet Take 1 mg by mouth daily.    Marland Kitchen warfarin (COUMADIN) 5 MG tablet Take 5 mg by mouth daily.     No current facility-administered medications for this visit.       REVIEW OF SYSTEMS (Negative unless checked)  Constitutional: [] Weight loss  [] Fever  [] Chills Cardiac: [] Chest pain   [] Chest pressure   [x] Palpitations   [] Shortness of breath when laying flat   [] Shortness of breath at rest   [] Shortness of breath with exertion. Vascular:  [] Pain in legs with walking   [] Pain in legs at rest   [] Pain in legs when laying flat   [] Claudication   [] Pain in feet when walking  [] Pain in feet at rest  [] Pain in feet when laying flat   [] History of DVT   [] Phlebitis   [] Swelling in legs    [] Varicose veins   [] Non-healing ulcers Pulmonary:   [] Uses home oxygen   [] Productive cough   [] Hemoptysis   [] Wheeze  [] COPD   [] Asthma Neurologic:  [] Dizziness  [] Blackouts   [] Seizures   [] History of stroke   [] History of TIA  [] Aphasia   [] Temporary blindness   [] Dysphagia   [] Weakness or numbness in arms   [x] Weakness or numbness in legs Musculoskeletal:  [x] Arthritis   [] Joint swelling   [] Joint pain   [] Low back pain Hematologic:  [] Easy bruising  [] Easy bleeding   [] Hypercoagulable state   [] Anemic  [] Hepatitis Gastrointestinal:  [] Blood in stool   [] Vomiting blood  [] Gastroesophageal reflux/heartburn   [] Abdominal pain Genitourinary:  [] Chronic kidney disease   [] Difficult urination  [] Frequent urination  [] Burning with urination   [] Hematuria Skin:  [] Rashes   [] Ulcers   [] Wounds Psychological:  [] History of anxiety   []  History of major depression.    Physical Exam BP (!) 168/83 (BP Location: Right Arm)   Pulse 74   Resp 16   Ht 5\' 11"  (1.803 m)   Wt 199 lb (90.3 kg)   BMI 27.75 kg/m  Gen:  WD/WN, NAD Head: [place]/AT, No temporalis wasting.  Ear/Nose/Throat: Hearing grossly intact, nares w/o erythema or drainage, oropharynx w/o Erythema/Exudate Eyes: Conjunctiva clear, sclera non-icteric  Neck: trachea midline.  No JVD.  Pulmonary:  Good air movement, respirations not labored, no use of accessory muscles  Cardiac: irregular Vascular:  Vessel Right Left  Radial Palpable Palpable                          DP 2+ 2+  PT 2+ 1+   Gastrointestinal:. No masses, surgical incisions, or scars. Musculoskeletal: M/S 5/5 throughout.  Extremities without ischemic changes.  No deformity or atrophy. Trace LE edema. Neurologic: Sensation grossly intact in extremities.  Symmetrical.  Speech is fluent. Motor exam as listed above. Psychiatric: Judgment intact, Mood & affect appropriate for pt's clinical situation. Dermatologic: No rashes or ulcers noted.  No cellulitis or open wounds.     Radiology Dg Chest 2 View  Result Date: 03/16/2018 CLINICAL DATA:  Extremity tingling, shortness of breath and congestion for a week. EXAM: CHEST - 2 VIEW COMPARISON:  CT chest December 31, 2017 FINDINGS: Cardiomediastinal silhouette is normal. No pleural effusions or focal consolidations. Trachea projects midline and there is no pneumothorax. Soft tissue planes and included osseous structures are non-suspicious. Mild thoracic spondylosis. IMPRESSION: No acute cardiopulmonary process. Electronically Signed   By: Thana Farr.D.  On: 03/16/2018 21:53    Labs Recent Results (from the past 2160 hour(s))  Surgical pathology     Status: None   Collection Time: 01/26/18 11:07 AM  Result Value Ref Range   SURGICAL PATHOLOGY      Surgical Pathology CASE: [id number] PATIENT: [facility] Surgical Pathology Report     SPECIMEN SUBMITTED: A. Colon polyp, cecum; cold snare B. Colon polyp x5, ascending; cold snare and cbx  CLINICAL HISTORY: None provided  PRE-OPERATIVE DIAGNOSIS: PH of colon polyps  POST-OPERATIVE DIAGNOSIS: Internal hemorrhoids, colon polyps     DIAGNOSIS: A. COLON POLYP, CECUM; COLD SNARE: - TUBULAR ADENOMA. - NEGATIVE FOR HIGH-GRADE DYSPLASIA AND MALIGNANCY.  B. COLON POLYPS X5, ASCENDING; COLD SNARE AND COLD BIOPSY: - MULTIPLE FRAGMENTS OF TUBULAR ADENOMAS. - NEGATIVE FOR HIGH-GRADE DYSPLASIA AND MALIGNANCY.  GROSS DESCRIPTION: A. Labeled: Cold snare polyp cecum colon Received: Formalin Tissue fragment(s): 1 Size: 0.5 cm Description: Tan soft tissue fragment Entirely submitted in 1 cassette.  B. Labeled: Cold snare and C BX polyp ascending colon x5 Received: Formalin Tissue fragment(s): Multiple Size: Aggregate, 1.7 x 0.5 x 0.3 cm Description: Tan  soft tissue fragments Entirely submitted in 1 cassette.   Final Diagnosis performed by Allena Napoleon, MD.   Electronically signed 01/27/2018 12:36:06PM The electronic signature indicates  that the named Attending Pathologist has evaluated the specimen  Technical component performed at [facility], [address] [place], [place] [address] Lab: [phone] Dir: Rush Farmer, MD, MMM  Professional component performed at [facility], [facility], [place], [place], [place] [address] Lab: [phone] Dir: Dellia Nims. Rubinas, MD   CBC     Status: Abnormal   Collection Time: 03/16/18  9:36 PM  Result Value Ref Range   WBC 11.7 (H) 4.0 - 10.5 K/uL   RBC 5.25 4.22 - 5.81 MIL/uL   Hemoglobin 16.8 13.0 - 17.0 g/dL   HCT 47.9 39.0 - 52.0 %   MCV 91.2 80.0 - 100.0 fL   MCH 32.0 26.0 - 34.0 pg   MCHC 35.1 30.0 - 36.0 g/dL   RDW 11.9 11.5 - 15.5 %   Platelets 275 150 - 400 K/uL   nRBC 0.0 0.0 - 0.2 %    Comment: Performed at [facility], [address] [place], [place] [address]  Comprehensive metabolic panel     Status: Abnormal   Collection Time: 03/16/18  9:36 PM  Result Value Ref Range   Sodium 138 135 - 145 mmol/L   Potassium 2.5 (LL) 3.5 - 5.1 mmol/L    Comment: CRITICAL RESULT CALLED TO, READ BACK BY AND VERIFIED WITH lLISA THOMPSON 2244 03/16/2018 SMA    Chloride 98 98 - 111 mmol/L   CO2 27 22 - 32 mmol/L   Glucose, Bld 171 (H) 70 - 99 mg/dL   BUN 22 8 - 23 mg/dL   Creatinine, Ser 1.50 (H) 0.61 - 1.24 mg/dL   Calcium 9.2 8.9 - 10.3 mg/dL   Total Protein 7.2 6.5 - 8.1 g/dL   Albumin 4.3 3.5 - 5.0 g/dL   AST 25 15 - 41 U/L   ALT 22 0 - 44 U/L   Alkaline Phosphatase 44 38 - 126 U/L   Total Bilirubin 0.7 0.3 - 1.2 mg/dL   GFR calc non Af Amer 45 (L) >60 mL/min   GFR calc Af Amer 52 (L) >60 mL/min   Anion gap 13 5 - 15    Comment: Performed at [facility], [address] [place],  [place] [address]  Troponin I - ONCE - STAT     Status: None   Collection Time: 03/16/18  9:36 PM  Result Value Ref Range   Troponin I <0.03 <0.03 ng/mL    Comment: Performed at [facility], [address] [place], [place] [address]   Protime-INR     Status: Abnormal   Collection Time: 03/16/18  9:36 PM  Result Value Ref Range   Prothrombin Time 17.1 (H) 11.4 - 15.2 seconds   INR 1.4 (H) 0.8 - 1.2    Comment: (NOTE) INR goal varies based on device and disease states. Performed at [facility], [address] [place], [place] [address]     Assessment/Plan:  Hyperlipidemia lipid control important in reducing the progression of atherosclerotic disease. Continue statin therapy   Essential hypertension blood pressure control important in reducing the progression of atherosclerotic disease. On appropriate oral medications.   Atrial fibrillation (HCC) On anticoagulation  Pain in limb He is studied with noninvasive studies today which demonstrate normal triphasic waveforms and ABIs of 1.1 bilaterally with normal digital pressures consistent with no arterial insufficiency.  It does not appear that his lower extremity symptoms are related to arterial insufficiency.  No further vascular testing or evaluation is necessary at this time.  A higher dose of Neurontin may be of benefit.  I will see him back as needed.      Leotis Pain 04/02/2018, 3:03 PM   This note was created with Dragon medical transcription system.  Any errors from dictation are unintentional.

## 2018-04-02 NOTE — Assessment & Plan Note (Signed)
On anticoagulation 

## 2018-04-14 DIAGNOSIS — M79671 Pain in right foot: Secondary | ICD-10-CM | POA: Insufficient documentation

## 2018-07-19 ENCOUNTER — Emergency Department: Payer: Medicare Other

## 2018-07-19 ENCOUNTER — Other Ambulatory Visit: Payer: Self-pay

## 2018-07-19 ENCOUNTER — Emergency Department
Admission: EM | Admit: 2018-07-19 | Discharge: 2018-07-19 | Disposition: A | Discharge status: Home / Self Care | Payer: Medicare Other | Attending: Emergency Medicine | Admitting: Emergency Medicine

## 2018-07-19 DIAGNOSIS — Y929 Unspecified place or not applicable: Secondary | ICD-10-CM | POA: Insufficient documentation

## 2018-07-19 DIAGNOSIS — I4891 Unspecified atrial fibrillation: Secondary | ICD-10-CM | POA: Diagnosis not present

## 2018-07-19 DIAGNOSIS — S73191A Other sprain of right hip, initial encounter: Secondary | ICD-10-CM | POA: Diagnosis not present

## 2018-07-19 DIAGNOSIS — Z79899 Other long term (current) drug therapy: Secondary | ICD-10-CM | POA: Insufficient documentation

## 2018-07-19 DIAGNOSIS — I10 Essential (primary) hypertension: Secondary | ICD-10-CM | POA: Diagnosis not present

## 2018-07-19 DIAGNOSIS — W19XXXA Unspecified fall, initial encounter: Secondary | ICD-10-CM | POA: Diagnosis not present

## 2018-07-19 DIAGNOSIS — F17210 Nicotine dependence, cigarettes, uncomplicated: Secondary | ICD-10-CM | POA: Diagnosis not present

## 2018-07-19 DIAGNOSIS — M7061 Trochanteric bursitis, right hip: Secondary | ICD-10-CM

## 2018-07-19 DIAGNOSIS — Y9389 Activity, other specified: Secondary | ICD-10-CM | POA: Insufficient documentation

## 2018-07-19 DIAGNOSIS — Y998 Other external cause status: Secondary | ICD-10-CM | POA: Insufficient documentation

## 2018-07-19 DIAGNOSIS — M25551 Pain in right hip: Secondary | ICD-10-CM | POA: Diagnosis present

## 2018-07-19 LAB — BASIC METABOLIC PANEL
Anion gap: 8 (ref 5–15)
BUN: 20 mg/dL (ref 8–23)
CO2: 26 mmol/L (ref 22–32)
Calcium: 8.8 mg/dL — ABNORMAL LOW (ref 8.9–10.3)
Chloride: 108 mmol/L (ref 98–111)
Creatinine, Ser: 1.19 mg/dL (ref 0.61–1.24)
GFR calc Af Amer: >60 mL/min (ref >60)
GFR calc non Af Amer: 59 mL/min — ABNORMAL LOW (ref >60)
Glucose, Bld: 101 mg/dL — ABNORMAL HIGH (ref 70–99)
Potassium: 4.7 mmol/L (ref 3.5–5.1)
Sodium: 142 mmol/L (ref 135–145)

## 2018-07-19 LAB — CBC
HCT: 46 % (ref 39.0–52.0)
Hemoglobin: 15.5 g/dL (ref 13.0–17.0)
MCH: 32.4 pg (ref 26.0–34.0)
MCHC: 33.7 g/dL (ref 30.0–36.0)
MCV: 96 fL (ref 80.0–100.0)
Platelets: 255 10*3/uL (ref 150–400)
RBC: 4.79 MIL/uL (ref 4.22–5.81)
RDW: 12.7 % (ref 11.5–15.5)
WBC: 11.3 10*3/uL — ABNORMAL HIGH (ref 4.0–10.5)
nRBC: 0 % (ref 0.0–0.2)

## 2018-07-19 MED ORDER — PREDNISONE 20 MG PO TABS: 40.0000 mg | ORAL_TABLET | Freq: Every day | ORAL | 0 refills | Status: AC

## 2018-07-19 MED ORDER — SODIUM CHLORIDE 0.9% FLUSH: 3.0000 mL | Freq: Once | INTRAVENOUS | Status: DC

## 2018-07-19 MED ORDER — PREDNISONE 20 MG PO TABS
40.0000 mg | ORAL_TABLET | Freq: Once | ORAL | Status: AC
  Administered 2018-07-19: 40 mg via ORAL
  Filled 2018-07-19: qty 2

## 2018-07-19 MED ORDER — HYDROCODONE-ACETAMINOPHEN 5-325 MG PO TABS: 1.0000 | ORAL_TABLET | Freq: Four times a day (QID) | ORAL | 0 refills | Status: AC | PRN

## 2018-07-19 MED ORDER — HYDROCODONE-ACETAMINOPHEN 5-325 MG PO TABS
1.0000 | ORAL_TABLET | Freq: Once | ORAL | Status: AC
  Administered 2018-07-19: 18:00:00 1 via ORAL
  Filled 2018-07-19: qty 1

## 2018-07-19 NOTE — ED Triage Notes (Signed)
Pt c/o increased swelling of BL LE for several weeks and has been to his PCP and is on 3 fluids pills, pt also c/o right hip pain from a fall a couple weeks ago, states he has been to his PCP and had xray but its not getting any better.

## 2018-07-19 NOTE — Discharge Instructions (Addendum)
No driving while taking hydrocodone.  Use only as prescribed, never more than prescribed.  Do not take your alprazolam within 8 hours of use of hydrocodone.

## 2018-07-19 NOTE — ED Provider Notes (Signed)
[facility] Emergency Department Provider Note   ____________________________________________   First MD Initiated Contact with Patient 07/19/18 1812     (approximate)  I have reviewed the triage vital signs and the nursing notes.   HISTORY  Chief Complaint Leg Swelling and Hip Pain    HPI Jeremiah Bell is a 78 y.o. male here for evaluation of right hip pain  Patient reports that he fell about a month ago on his right hip.  Started to heal up, was very tender but got better.  He fell again about 2 weeks ago, and since that time he had ongoing pain right over the outside of his hip.  He had no fevers or chills.  No cough or shortness of breath.  He does report having swelling in both lower legs, wears compression stockings, and is taking medications and following with his primary for this.  Reports he came today for evaluation of his right hip pain  No fevers or chills.  No numbness or weakness in the legs.  He is able to walk on it but has to use a cane, reports he is given a prescription for tramadol but it does not really help much with his pain or discomfort.  Patient does report he is taking hydrocodone the past, thinks this may be more helpful.     Past Medical History:  Diagnosis Date   A-fib [facility])    Anxiety    BPH (benign prostatic hyperplasia)    GERD (gastroesophageal reflux disease)    Hx of basal cell carcinoma 01/22/2015   L crown of scalp   Hx of squamous cell carcinoma of skin 01/28/2016   L distal pretibial, R medial cheek   Hyperlipidemia    Hypertension    Irritable bowel     Patient Active Problem List   Diagnosis Date Noted   Hyperlipidemia 04/02/2018   Essential hypertension 04/02/2018   Atrial fibrillation ([place]) 04/02/2018   Pain in limb 04/02/2018   Personal history of tobacco use, presenting hazards to health 12/31/2017    Past Surgical History:  Procedure Laterality Date   BASAL CELL  CARCINOMA EXCISION Left 01/22/2015   crown of scalp   COLONOSCOPY WITH PROPOFOL N/A 01/26/2018   Procedure: COLONOSCOPY WITH PROPOFOL;  Surgeon: Toledo, Benay Pike, MD;  Location: [facility] ENDOSCOPY;  Service: Gastroenterology;  Laterality: N/A;    Prior to Admission medications   Medication Sig Start Date End Date Taking? Authorizing Provider  albuterol (PROVENTIL HFA;VENTOLIN HFA) 108 (90 Base) MCG/ACT inhaler Inhale 2 puffs into the lungs every 6 (six) hours as needed for wheezing or shortness of breath. 04/21/16   Nance Pear, MD  ALPRAZolam Duanne Moron) 1 MG tablet Take 1 mg by mouth 2 (two) times daily as needed for anxiety.    [provider]  dicyclomine (BENTYL) 10 MG capsule Take 10 mg by mouth 4 (four) times daily as needed. 04/10/16   [provider]  flecainide (TAMBOCOR) 50 MG tablet Take 50 mg by mouth 2 (two) times daily. 04/09/16   [provider]  HYDROcodone-acetaminophen (NORCO/VICODIN) 5-325 MG tablet Take 1 tablet by mouth every 6 (six) hours as needed for moderate pain. 07/19/18   Delman Kitten, MD  omeprazole (PRILOSEC) 40 MG capsule Take 40 mg by mouth daily. 04/10/16   [provider]  predniSONE (DELTASONE) 20 MG tablet Take 2 tablets (40 mg total) by mouth daily. 07/19/18   Delman Kitten, MD  senna-docusate (SENOKOT-S) 8.6-50 MG tablet Take  1 tablet by mouth 2 (two) times daily.    [provider]  simvastatin (ZOCOR) 40 MG tablet Take 40 mg by mouth daily.    [provider]  tamsulosin (FLOMAX) 0.4 MG CAPS capsule Take 0.4 mg by mouth 2 (two) times daily. 04/10/16   [provider]  torsemide (DEMADEX) 10 MG tablet Take 30 mg by mouth daily.    [provider]  traZODone (DESYREL) 50 MG tablet Take 50 mg by mouth at bedtime.    [provider]  vitamin B-12 (CYANOCOBALAMIN) 1000 MCG tablet Take 1,000 mcg by mouth daily.    [provider]  warfarin (COUMADIN) 1 MG tablet Take 1 mg by mouth daily.     [provider]  warfarin (COUMADIN) 5 MG tablet Take 5 mg by mouth daily.    [provider]    Allergies Erythromycin, Sulfa antibiotics, and Terramycin [oxytetracycline]  Family History  Problem Relation Age of Onset   Suicidality Mother    Vascular Disease Mother    Alzheimer's disease Father    Suicidality Paternal Grandfather     Social History Social History   Tobacco Use   Smoking status: Current Every Day Smoker    Packs/day: 1.00    Years: 57.00    Pack years: 57.00    Types: Cigarettes   Smokeless tobacco: Never Used   Tobacco comment: currently smokes .5ppd  Substance Use Topics   Alcohol use: Yes    Alcohol/week: 5.0 standard drinks    Types: 2 Glasses of wine, 3 Cans of beer per week    Comment: daily   Drug use: Never    Review of Systems Constitutional: No fever/chills Eyes: No visual changes. ENT: No sore throat. Cardiovascular: Denies chest pain. Respiratory: Denies shortness of breath. Gastrointestinal: No abdominal pain.   Genitourinary: Negative for dysuria. Musculoskeletal: Negative for back pain.  No neck pain.  See HPI.  No cold or blue foot.  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 Neurological: Negative for headaches, areas of focal weakness or numbness.    ____________________________________________   PHYSICAL EXAM:  VITAL SIGNS: ED Triage Vitals [07/19/18 1413]  Enc Vitals Group     BP 133/67     Pulse Rate 77     Resp 17     Temp 98.5 F (36.9 C)     Temp Source Oral     SpO2 97 %     Weight 218 lb (98.9 kg)     Height 5\' 11"  (1.803 m)     Head Circumference      Peak Flow      Pain Score 3     Pain Loc      Pain Edu?      Excl. in [place]?     Constitutional: Alert and oriented. Well appearing and in no acute distress.  Very pleasant. Eyes: Conjunctivae are normal. Head: Atraumatic. Nose: No congestion/rhinnorhea. Mouth/Throat: Mucous membranes are  moist. Neck: No stridor.  Cardiovascular: Normal rate, regular rhythm. Grossly normal heart sounds.  Good peripheral circulation. Respiratory: Normal respiratory effort.  No retractions. Lungs CTAB. Gastrointestinal: Soft and nontender. No distention. Musculoskeletal:   RIGHT Right upper extremity demonstrates normal strength, good use of all muscles. No edema bruising or contusions of the right shoulder/upper arm, right elbow, right forearm / hand. Full range of motion of the right right upper extremity without pain. No evidence of trauma. Strong radial pulse. Intact median/ulnar/radial neuro-muscular  exam.  LEFT Left upper extremity demonstrates normal strength, good use of all muscles. No edema bruising or contusions of the left shoulder/upper arm, left elbow, left forearm / hand. Full range of motion of the left  upper extremity without pain. No evidence of trauma. Strong radial pulse. Intact median/ulnar/radial neuro-muscular exam.  Lower Extremities  No edema. Normal DP/PT pulses bilateral with good cap refill.  Normal neuro-motor function lower extremities bilateral.  RIGHT Right lower extremity demonstrates normal strength, good use of all muscles. No edema bruising or contusions of the right hip, right knee, right ankle except for small about quarter sized ecchymosis over the right lateral trochanteric region. Full range of motion of the right lower extremity but he reports pain over the right greater trochanteric region and buttock with movement. No pain on axial loading.  Full range of motion of the knee ankle foot and no tenderness of the long bones except over the right greater trochanter.  No  thoracic or lumbar tenderness.  No cervical tenderness  LEFT Left lower extremity demonstrates normal strength, good use of all muscles. No edema bruising or contusions of the hip,  knee, ankle. Full range of motion of the left lower extremity without pain. No pain on axial loading. No  evidence of trauma.   Neurologic:  Normal speech and language. No gross focal neurologic deficits are appreciated.  Skin:  Skin is warm, dry and intact. No rash noted. Psychiatric: Mood and affect are normal. Speech and behavior are normal.  ____________________________________________   LABS (all labs ordered are listed, but only abnormal results are displayed)  Labs Reviewed  BASIC METABOLIC PANEL - Abnormal; Notable for the following components:      Result Value   Glucose, Bld 101 (*)    Calcium 8.8 (*)    GFR calc non Af Amer 59 (*)    All other components within normal limits  CBC - Abnormal; Notable for the following components:   WBC 11.3 (*)    All other components within normal limits   ____________________________________________  EKG   ____________________________________________  RADIOLOGY  MRI reviewed, wet read positive for right sided trochanteric bursitis and labral tear.  No fracture ____________________________________________   PROCEDURES  Procedure(s) performed: None  Procedures  Critical Care performed: No  ____________________________________________   INITIAL IMPRESSION / ASSESSMENT AND PLAN / ED COURSE  Pertinent labs & imaging results that were available during my care of the patient were reviewed by me and considered in my medical decision making (see chart for details).   Patient reports having x-rays of the hip twice in the last 2 weeks.  He he is here for evaluation hip pain.  Will trial hydrocodone, and discussed discontinuation of tramadol which the patient is in agreement with.  Additionally discussed not using his Xanax within 6 hours hydrocodone use he is also agreeable this.  I doubt occult fracture, but given his ongoing complaint will obtain MRI of the right hip to exclude hip fracture.  No evidence of any other injury.  Does have bilateral lower extremity edema, but no shortness of breath and he reports that he is seeing his  primary for this uses compression stockings and did not come to the ER for evaluation of his edema.  ----------------------------------------- 10:40 PM on 07/19/2018 -----------------------------------------  Return precautions and treatment recommendations and follow-up discussed with the patient who is agreeable with the plan. Not driving self home. Pain improved with hydrocodone.  Fully awake alert no distress.  Reviewed treatment  recommendations, steroids, and plan for close follow-up with the patient is in agreement  Return precautions and treatment recommendations and follow-up discussed with the patient who is agreeable with the plan.  I will prescribe the patient a narcotic pain medicine due to their condition which I anticipate will cause at least moderate pain short term. I discussed with the patient safe use of narcotic pain medicines, and that they are not to drive, work in dangerous areas, or ever take more than prescribed (no more than 1 pill every 6 hours). We discussed that this is the type of medication that can be  overdosed on and the risks of this type of medicine. Patient is very agreeable to only use as prescribed and to never use more than prescribed.        ____________________________________________   FINAL CLINICAL IMPRESSION(S) / ED DIAGNOSES  Final diagnoses:  Trochanteric bursitis of right hip  Tear of right acetabular labrum, initial encounter        Note:  This document was prepared using Dragon voice recognition software and may include unintentional dictation errors       Delman Kitten, MD 07/19/18 2243

## 2018-09-06 ENCOUNTER — Ambulatory Visit: Payer: Medicare Other | Attending: Internal Medicine | Admitting: Physical Therapy

## 2018-09-06 ENCOUNTER — Encounter: Payer: Self-pay | Admitting: Physical Therapy

## 2018-09-06 ENCOUNTER — Other Ambulatory Visit: Payer: Self-pay

## 2018-09-06 DIAGNOSIS — M25551 Pain in right hip: Secondary | ICD-10-CM

## 2018-09-06 DIAGNOSIS — R251 Tremor, unspecified: Secondary | ICD-10-CM

## 2018-09-06 DIAGNOSIS — R296 Repeated falls: Secondary | ICD-10-CM

## 2018-09-06 DIAGNOSIS — M25552 Pain in left hip: Secondary | ICD-10-CM | POA: Diagnosis present

## 2018-09-06 DIAGNOSIS — M6281 Muscle weakness (generalized): Secondary | ICD-10-CM

## 2018-09-06 DIAGNOSIS — M5442 Lumbago with sciatica, left side: Secondary | ICD-10-CM

## 2018-09-06 DIAGNOSIS — M5440 Lumbago with sciatica, unspecified side: Secondary | ICD-10-CM | POA: Insufficient documentation

## 2018-09-06 DIAGNOSIS — G8929 Other chronic pain: Secondary | ICD-10-CM | POA: Insufficient documentation

## 2018-09-06 NOTE — Therapy (Signed)
[facility] [address], [place], [address] Phone: [phone]   Fax:  [phone]  Physical Therapy Evaluation  Patient Details  Name: Jeremiah Bell MRN: [id number] Date of Birth: 20-Mar-1940 Referring Provider (PT): Tracie Harrier, MD   Encounter Date: 09/06/2018  PT End of Session - 09/06/18 1620    Visit Number  1    Number of Visits  24    Date for PT Re-Evaluation  11/29/18    Authorization Type  Medicare reporting period from 09/06/2018    Authorization Time Period  Current Cert period: 0000000 - 11/29/2018 (latest PN: IE 09/06/2018);    Authorization - Visit Number  1    Authorization - Number of Visits  10    PT Start Time  1440    PT Stop Time  1545    PT Time Calculation (min)  65 min    Equipment Utilized During Treatment  Gait belt    Activity Tolerance  Patient tolerated treatment well;Patient limited by fatigue;Patient limited by pain    Behavior During Therapy  WFL for tasks assessed/performed       Past Medical History:  Diagnosis Date  . A-fib ([place])   . Anxiety   . BPH (benign prostatic hyperplasia)   . GERD (gastroesophageal reflux disease)   . Hx of basal cell carcinoma 01/22/2015   L crown of scalp  . Hx of squamous cell carcinoma of skin 01/28/2016   L distal pretibial, R medial cheek  . Hyperlipidemia   . Hypertension   . Irritable bowel     Past Surgical History:  Procedure Laterality Date  . BASAL CELL CARCINOMA EXCISION Left 01/22/2015   crown of scalp  . COLONOSCOPY WITH PROPOFOL N/A 01/26/2018   Procedure: COLONOSCOPY WITH PROPOFOL;  Surgeon: Toledo, Benay Pike, MD;  Location: [facility] ENDOSCOPY;  Service: Gastroenterology;  Laterality: N/A;    There were no vitals filed for this visit.   Subjective Assessment - 09/06/18 1614    Subjective  Patient states R hip pain started about 2-3 weeks ago when he fell against a fence in the hallway of his home. Fence is  to keep the dog in and has a gate in it. He opened the gate and stepped through and fell on his face on the R side. He started having trouble sleeping. When he put his R foot on the floor, he would have terrible pain, so he had to crawl on the floor and get up and had trouble standing in the shower. He is now sleeping on the floor or in the recliner. He fell again on the L hip. Feel backwards after stepping on a dog's tail. He feels like he has not walked anywhere or done any exercise, so he doesn't have any strength in the leg. If he falls, he has to crawl to something he can pull himself up with. He hears his foot hitting the floor. He states he has neuropathy too, which isn't comfortable. His Left hip is not bothering him but his back and R side is hurting now. It hurts too bad if he steps on the R leg. He feels his R leg is gradually getting better if he sleeps in the chair, but not in the bed. Hard to get out of the chair. Feels like he is falling forward and doesn't have the strength to pick up feet, R > L. Completes stairs with HR and SPC.  Usually uses SPC.  Has one crutch. No BSC, elevated toilet, shower chair. States he retired 10 years ago. Feels he needs to walk more to get stronger.    Pertinent History  Patient is a 78 y.o. male who presents to outpatient physical therapy with a referral for medical diagnosis weakness of both lower extremities, ambulatory dysfunction, history of recent fall. This patient's chief complaints consist of pain in right hip and leg, LE weakness, difficulty walking, and impaired balance leading to the following functional deficits: difficulty with ADLs, IADLs, housework, cooking, bathing, sleeping, difficulty getting up from chair or floor, difficulty walking, high fall risk, frequent falls with injury. Relevant past medical history and comorbidities include a-fib (on warfarin), 6 weeks ago where he thought he was having a heart attack and he went to the heart attack and it  was determined to be A-fib, HTN, fatty liver (decreased drinking), hyperlipidemia, neuropathy in both legs (wears compression socks), denies "not really" history of back problems, Hx of skin cancer (removed on face), anxiety. Denies hx of stroke, MI, seizures, DM, or parkinson's.    Limitations  House hold activities;Lifting;Standing;Walking;Other (comment)   difficulty with ADLs, IADLs, housework, cooking, bathing, sleeping, difficulty getting up from chair or floor, difficulty walking, high fall risk, frequent falls with injury   How long can you sit comfortably?  not a major limitation    How long can you stand comfortably?  depends on time of day, in the morning cannot walk for 40 min after attempting to get up.    How long can you walk comfortably?  depends on time of day, in the morning cannot walk for 40 min after attempting to get up.    Diagnostic tests  MRI R hip from 07/19/2018: "IMPRESSION: 1. Inflammation superficial and deep to the iliotibial band adjacent to the right greater trochanter. No defined fluid in the bursal space. 2. Diffuse degeneration of the superolateral and anterior aspects of the right acetabular labrum consistent with intrasubstance tears. 3. 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    Currently in Pain?  Yes    Pain Score  3    best 0/10, at worst 9/10   Pain Location  Other (Comment)   most painful near R SIJ, feels it down the entire leg (until neuropathy takes over). Denies groin pain or lateral hip pain. Ocassional similar L pain that is not bothering him much lately   Pain Orientation  Right;Left;Posterior    Pain Descriptors / Indicators  Other (Comment);Shooting   intolerable when he put weight on R leg. Feels inside leg. Hurts so bad it may be a shooting or hurting pain. Will make a grown man cry in the shower. Get better in 30-40 minutes to get straight  laying on the floor.   Pain Type  Acute pain;Chronic pain    Pain Radiating Towards  down entire R leg to where pt no longer has sensation due to bilateral peripheral neuropathy. Denies paresthesia    Pain Onset  More than a month ago   per documentation   Pain Frequency  Intermittent    Aggravating Factors   weight bearing R, worse in morning, after prolonged sitting, after sleeping in the bed (cannot sleep on R side or back - used to sleep on R side).    Pain Relieving Factors  sitting, laying on the floor 40 min  to get the R leg straight.    Effect of Pain on Daily Activities  difficulty with ADLs, IADLs, housework, cooking, bathing, sleeping, difficulty getting up from chair or floor, difficulty walking, high fall risk, frequent falls with injury.          [facility] PT Assessment - 09/06/18 0001      Assessment   Medical Diagnosis  weakness of both lower extremities, ambulatory dysfunction, history of recent fall    Referring Provider (PT)  Tracie Harrier, MD    Onset Date/Surgical Date  07/26/18    Hand Dominance  Right    Next MD Visit  2 weeks    Prior Therapy  1-2 visits and it didn't seem to be helping;       Precautions   Precautions  Fall      Restrictions   Weight Bearing Restrictions  No      Balance Screen   Has the patient fallen in the past 6 months  Yes    How many times?  2    Has the patient had a decrease in activity level because of a fear of falling?   Yes    Is the patient reluctant to leave their home because of a fear of falling?   Yes      [place]  Private residence    Living Arrangements  Spouse/significant other;Children   adult son   Available Help at Discharge  Family    Type of [place] Access  Level entry    [place]  Two level    Alternate Level Stairs-Number of Steps  15    [place] - single point   no other equipment except single axial crutch      Prior Function   Level of Independence  Independent    Vocation  Retired    [employer]  worked for [employer] in [place] (outside work)    Leisure  loves to fish [place]ocean[place]. Housecleaning and cooking to help with rent, dog at home (trip hazard), hunting      Cognition   Overall Cognitive Status  Within Functional Limits for tasks assessed      Observation/Other Assessments   Observations  see note from 09/06/2018 for latest objective data       OBJECTIVE: PATIENT REPORTED OUTCOMES:  Patient Specific Functional Scale:  1. Walking: 7/10 2. Moving around following sleep: 0/10 3. Hip is better: 6/10  OBSERVATION/INSPECTION:  Patient presents with abdominal obesity, diastasis recti with abdominal activation,  LE edema with bilateral compression socks to knees.   stands slightly shifted to R, stooped trunk, flattened lumbar curve.   tremor in B UE and B LE when moving.   NEUROLOGICAL:  Dermatomes: BLE equal and intact to light touch except stocking pattern with no sensation in feet.   Myotomes: appears to be [facility] - weakness not in myotomal pattern (see below).   Upper Motor Neuron Screen: Hoffman's, and Clonus (ankle) negative bilaterally.  COORDINATION:  Nose to finger test: poor (unable to hit clinician's finger)  Dysdiadokinesia: fair (able to complete evenily at decreased rate with decreased ROM)  Shin drag: fair (limited in ROM and control of heel bilaterally)   SPINE MOTION Lumbar AROM:  *Indicates pain - Flexion: = mid shins with knees bent, stiff. - Extension: = 0%. - Rotation: R = 75%,  L = 75%. - Side Flexion: R = 25%, L = 25%.  PERIPHERAL JOINT MOTION (AROM/PROM in degrees):  *Indicates pain Hip  - Flexion: R = WFL , L =  WFL - Extension: R = 20 degrees, rectus tightness/pain, B= 20 degrees, rectus tightness. - External rotation: R = WNL, L = WNL - Internal rotation: R = 0, L = 0. Knee - Flexion: B =  grossly equal and WFL, mild limitation in flexion. ..  - Extension: B = 0 Ankle:  - Restricted in DF and PF bilaterally    STRENGTH:  *Indicates pain  Hip  - Flexion: R = 4/5, L = 4/5. - Abduction: R = 4/5, L = 4+/5. Knee - Ext: R = 4+/5, L = 4+/5. - Flex: R = 4/5, L = 4/5. Ankle (seated position) - Dorsiflexion: R = 4//5, L = 4/5. - Able to heel walk and toe walk equally with BUE support and decreased ROM.  - Still able to resist into DF following 6MWT.   REPEATED MOTIONS TESTING:  Prone press up x 10: during = difficulty due to arm weakness; after = no effect. Patient with deminished ability to reach end range as test went on due to BUE fatigue. Unable to relax glutes or back muscles. Very stiff  SPECIAL TESTS:  Straight leg raise (SLR): R = glute painful but no change with sensitizing manuever, L = positive for glute pain.  Hip Scour: B = negative  FABER: B = negative  FADDIR: B = negative  ACCESSORY MOTION:  - Highly hypomobile to CPA along lumbar spine, Tender to CPA at lower segments, but no reproduction of concordant pain.   PALPATION: - TTP with reproduction of concordant pain over R glute near piriformis.  - Not significantly TTP over either greater trochanter. .  FUNCTIONAL MOBILITY: - Bed mobility: supine <> sit and rolling, independent with difficulty due to weakness, stiffness.  - Transfers: sit <> stand transfer with BUE support and multiple attempts due to failure to shift forward adequately. Difficulty coordinating movement.  - Gait: stooped posture, scuffing both feet, non-festinating but decreased heel strike, frequent stumbles forward without AD. Required CGA - min A to prevent falls. Ambulated 1042 feet total with pain and feeling of weakness in legs stopping him.  - Stairs: deferred due to fatigue after 6MWT.   FUNCTIONAL/BALANCE TESTS:  Six Minute Walk Test: [id number] feet with scuffing gait pattern, with frequent LOB forward and sideways leaning  towards inside of turn and grabbing for objects to stabilize self; reported increased L glute and R glute pain by end with report of B LE weakness that made him feel he could barely finish.   5TSTS: 27.5 with BUE support, CGA from 18.5 inch plinth. (required 2 attempts due to multiple failed attempts to stand first set).    EDUCATION/COGNITION: Patient is alert and oriented X 4.  Objective measurements completed on examination: See above findings.     TREATMENT:   Therapeutic exercise: to centralize symptoms and improve ROM, strength, muscular endurance, and activity tolerance required for successful completion of functional activities.  - sit <> stand x 10 from low plinth with SBA - min A with cuing for improved mechanics for improved mobility and safety.  - ambulated 200 feet out to car with CGA for safety.  - Exercise purpose/form. Self management techniques. Education on diagnosis, prognosis, POC, anatomy and physiology of current condition   Patient response to treatment:  Pt tolerated treatment well. Pt was able to  complete all exercises with mild increase in pain by end of session that patient found tolerable. Pt required multimodal cuing for proper technique and to facilitate improved neuromuscular control, strength, range of motion, and functional ability resulting in improved performance and form.    PT Education - 09/06/18 1620    Education Details  Exercise purpose/form. Self management techniques. Education on diagnosis, prognosis, POC, anatomy and physiology of current condition    Person(s) Educated  Patient    Methods  Explanation;Demonstration;Verbal cues;Tactile cues    Comprehension  Verbalized understanding;Returned demonstration       PT Short Term Goals - 09/06/18 1622      PT SHORT TERM GOAL #1   Title  Be independent with initial home exercise program for self-management of symptoms.    Baseline  to be provided visit 2 (09/06/2018);    Time  2    Period   Weeks    Status  New    Target Date  09/20/18      PT SHORT TERM GOAL #2   Title  Improve 6MWT to equal or greater than 1200 feet to demonstrate improved activity tolerance for community ambulation.    Baseline  1042 feet with no AD and CGA with multiple stumbles and grabbing onto things (09/06/2018);    Time  6    Period  Weeks    Status  New    Target Date  10/18/18      PT SHORT TERM GOAL #3   Title  Patient will improve 5TSTS test to equal or less than 20 seconds to demonstrate improved ability to complete transfers and move around home safely.    Baseline  27.5 with BUE support (09/06/2018);    Time  6    Period  Weeks    Status  New    Target Date  10/18/18      PT SHORT TERM GOAL #4   Title  Patient will improve by at least one point for each of the items on PSFS to show improvement in self-reported function:    Baseline  Walking: 7, moving after sleeping: 0, hip is better: 6 (09/06/2018);    Time  6    Period  Weeks    Status  New    Target Date  10/18/18        PT Long Term Goals - 09/06/18 1636      PT LONG TERM GOAL #1   Title  Be independent with a long-term home exercise program for self-management of symptoms.    Baseline  initial HEP to be provided at visit 2 (09/06/2018);    Time  12    Period  Weeks    Status  New    Target Date  11/29/18      PT LONG TERM GOAL #2   Title  Improve 6MWT to equal or greater than 1400 feet to demonstrate improved activity tolerance for community ambulation.    Baseline  1042 feet with no AD and CGA with multiple stumbles and grabbing onto things (09/06/2018);    Time  12    Period  Weeks    Status  New    Target Date  11/29/18      PT LONG TERM GOAL #3   Title  Patient will improve 5TSTS test to equal or less than 20 seconds with no UE support to demonstrate improved ability to complete transfers and move around home safely.    Baseline  27.5 with BUE  support (09/06/2018);    Time  12    Period  Weeks    Status  New     Target Date  10/18/18      PT LONG TERM GOAL #4   Title  Patient will improve to equal or greater than 8/10 on each of the items on PSFS to show improvement in self-reported function.    Baseline  Walking: 7, moving after sleeping: 0, hip is better: 6 (09/06/2018);    Time  12    Period  Weeks    Status  New    Target Date  11/29/18      PT LONG TERM GOAL #5   Title  Patient will score greater than 19/30 on Functional Gait Assessment to demonstrate moderate or low fall risk.    Baseline  measurement deferred due to fatigue (09/06/2018);    Time  12    Period  Weeks    Status  New    Target Date  11/01/18      Plan - 09/06/18 1941    Clinical Impression Statement  Patient is a 78 y.o. male referred to outpatient physical therapy with a medical diagnosis of weakness of both lower extremities, ambulatory dysfunction, history of recent fall who presents with signs and symptoms consistent with bilateral glute/posterior hip pain R > L with radiation down R leg when aggravated, difficulty walking, high fall risk, tremor and incoordination, and generalized deconditioning and activity intolerance. Patient's pain pattern and difficulty with ambulation with lumbar posture and stiffness suggest spinal stenosis as a possible source of symptoms. Hip specific special tests did not suggest intraarticular pathology or trochanteric bursitis. Patient does present with significant coordination difficulties and tremor that are negatively affecting his gait and balance and are not commonly caused by with generalized deconditioning, hip pathology, or spinal stenosis. Patient presents with significant balance, tremor, incoordination, pain, stiffness, activity tolerance, gait pattern, strength/endurance/power impairments that are limiting ability to complete ADLs, IADLs, housework, cooking, bathing, sleeping, transferring, walking household and community distances, stairs, community participation without difficulty and  patient demonstrates high fall risk with multiple previous falls with injury. Patient will benefit from skilled physical therapy intervention to address current body structure impairments and activity limitations to improve function and work towards goals set in current POC in order to return to prior level of function or maximal functional improvement.    Personal Factors and Comorbidities  Age;Time since onset of injury/illness/exacerbation;Behavior Pattern;Comorbidity 3+;Fitness;Past/Current Experience    Comorbidities  a-fib (on warfarin), 6 weeks ago where he thought he was having a heart attack and he went to the heart attack and it was determined to be A-fib, HTN, fatty liver (decreased drinking), hyperlipidemia, neuropathy in both legs (wears compression socks), denies "not really" history of back problems, Hx of skin cancer (removed on face), anxiety. Denies hx of stroke, MI, seizures, DM, or parkinson's.    Examination-Activity Limitations  Bathing;Hygiene/Grooming;Squat;Stairs;Lift;Bed Mobility;Bend;Locomotion Level;Stand;Caring for [employer];Toileting;Carry;Transfers;Dressing;Sleep;Other   difficulty with ADLs, IADLs, housework, cooking, bathing, sleeping, difficulty getting up from chair or floor, difficulty walking, high fall risk, frequent falls with injury.   Examination-Participation Restrictions  Shop;Cleaning;Community Activity;Meal Prep;Yard Work;Driving;Interpersonal Relationship    Stability/Clinical Decision Making  Evolving/Moderate complexity    Clinical Decision Making  Moderate    Rehab Potential  Good    PT Frequency  2x / week    PT Duration  12 weeks    PT Treatment/Interventions  ADLs/Self Care Home Management;Aquatic Therapy;Canalith Repostioning;Cryotherapy;Moist Heat;Electrical Stimulation;Traction;DME Instruction;Gait training;Stair training;Functional  mobility training;Therapeutic activities;Therapeutic exercise;Balance training;Neuromuscular  re-education;Patient/family education;Manual techniques;Passive range of motion;Dry needling;Spinal Manipulations;Joint Manipulations;Other (comment)   joint mobilizations grades I-IV   PT Next Visit Plan  establish HEP, complete baseline FGA    PT Home Exercise Plan  to be established at visit 2    Consulted and Agree with Plan of Care  Patient       Patient will benefit from skilled therapeutic intervention in order to improve the following deficits and impairments:  Abnormal gait, Decreased knowledge of use of DME, Impaired sensation, Improper body mechanics, Pain, Decreased coordination, Decreased mobility, Postural dysfunction, Decreased activity tolerance, Decreased endurance, Decreased range of motion, Decreased strength, Hypomobility, Impaired perceived functional ability, Obesity, Impaired flexibility, Increased edema, Difficulty walking, Decreased safety awareness, Decreased balance  Visit Diagnosis: Muscle weakness (generalized)  Repeated falls  Tremor  Pain in right hip  Pain in left hip  Chronic bilateral low back pain with sciatica, sciatica laterality unspecified     Problem List Patient Active Problem List   Diagnosis Date Noted  . Hyperlipidemia 04/02/2018  . Essential hypertension 04/02/2018  . Atrial fibrillation ([place]) 04/02/2018  . Pain in limb 04/02/2018  . Personal history of tobacco use, presenting hazards to health 12/31/2017    Everlean Alstrom. Graylon Good, PT, DPT 09/06/18, 7:46 PM  [facility] [address], [place], [address] Phone: [phone]   Fax:  [phone]  Name: Jeremiah Bell MRN: [id number] Date of Birth: [birth year]

## 2018-09-08 ENCOUNTER — Ambulatory Visit: Payer: Medicare Other | Admitting: Physical Therapy

## 2018-09-09 ENCOUNTER — Other Ambulatory Visit: Payer: Self-pay

## 2018-09-09 ENCOUNTER — Ambulatory Visit: Payer: Medicare Other | Admitting: Physical Therapy

## 2018-09-09 DIAGNOSIS — R296 Repeated falls: Secondary | ICD-10-CM

## 2018-09-09 DIAGNOSIS — M25551 Pain in right hip: Secondary | ICD-10-CM

## 2018-09-09 DIAGNOSIS — M25552 Pain in left hip: Secondary | ICD-10-CM

## 2018-09-09 DIAGNOSIS — M6281 Muscle weakness (generalized): Secondary | ICD-10-CM | POA: Diagnosis not present

## 2018-09-09 DIAGNOSIS — G8929 Other chronic pain: Secondary | ICD-10-CM

## 2018-09-09 DIAGNOSIS — R251 Tremor, unspecified: Secondary | ICD-10-CM

## 2018-09-09 NOTE — Therapy (Signed)
[facility] [address], [place], [address] Phone: [phone]   Fax:  [phone]  Physical Therapy Treatment  Patient Details  Name: Jeremiah Bell MRN: [id number] Date of Birth: 11-17-1940 Referring Provider (PT): Tracie Harrier, MD   Encounter Date: 09/09/2018  PT End of Session - 09/10/18 1048    Visit Number  2    Number of Visits  24    Date for PT Re-Evaluation  11/29/18    Authorization Type  Medicare reporting period from 09/06/2018    Authorization Time Period  Current Cert period: 0000000 - 11/29/2018 (latest PN: IE 09/06/2018);    Authorization - Visit Number  2    Authorization - Number of Visits  10    PT Start Time  1520    PT Stop Time  1605    PT Time Calculation (min)  45 min    Equipment Utilized During Treatment  Gait belt    Activity Tolerance  Patient tolerated treatment well;Patient limited by fatigue    Behavior During Therapy  WFL for tasks assessed/performed       Past Medical History:  Diagnosis Date  . A-fib ([place])   . Anxiety   . BPH (benign prostatic hyperplasia)   . GERD (gastroesophageal reflux disease)   . Hx of basal cell carcinoma 01/22/2015   L crown of scalp  . Hx of squamous cell carcinoma of skin 01/28/2016   L distal pretibial, R medial cheek  . Hyperlipidemia   . Hypertension   . Irritable bowel     Past Surgical History:  Procedure Laterality Date  . BASAL CELL CARCINOMA EXCISION Left 01/22/2015   crown of scalp  . COLONOSCOPY WITH PROPOFOL N/A 01/26/2018   Procedure: COLONOSCOPY WITH PROPOFOL;  Surgeon: Toledo, Benay Pike, MD;  Location: [facility] ENDOSCOPY;  Service: Gastroenterology;  Laterality: N/A;    There were no vitals filed for this visit.  Subjective Assessment - 09/10/18 1046    Subjective  Patient states he is feeling well without any pain upon arrival. Remembered his [facility] today. States he continued to have difficulty findind the  clinic (although he arrived early to his appt). States he lost his keys to his truck and it took him a while to find them. He keeps two Brooks with him so if he loses one he doesn't get locked out of his truck. States he felt okay following last PT session.    Pertinent History  Patient is a 78 y.o. male who presents to outpatient physical therapy with a referral for medical diagnosis weakness of both lower extremities, ambulatory dysfunction, history of recent fall. This patient's chief complaints consist of pain in right hip and leg, LE weakness, difficulty walking, and impaired balance leading to the following functional deficits: difficulty with ADLs, IADLs, housework, cooking, bathing, sleeping, difficulty getting up from chair or floor, difficulty walking, high fall risk, frequent falls with injury. 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 Denies hx of stroke, MI, seizures, DM, or parkinson's.    Limitations  House hold activities;Lifting;Standing;Walking;Other (comment)   difficulty with ADLs, IADLs, housework, cooking, bathing,  sleeping, difficulty getting up from chair or floor, difficulty walking, high fall risk, frequent falls with injury   How long can you sit comfortably?  not a major limitation    How long can you stand comfortably?  depends on time of day, in the morning cannot walk for 40 min after attempting to get up.    How long can you walk comfortably?  depends on time of day, in the morning cannot walk for 40 min after attempting to get up.    Diagnostic tests  MRI R hip from 07/19/2018: "IMPRESSION: 1. Inflammation  superficial and deep to the iliotibial band adjacent to the right greater trochanter. No defined fluid in the bursal space. 2. Diffuse degeneration of the superolateral and anterior aspects of the right acetabular labrum consistent with intrasubstance tears. 3. 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    Currently in Pain?  No/denies    Pain Onset  More than a month ago   per documentation        TREATMENT:   Therapeutic exercise:to centralize symptoms and improve ROM, strength, muscular endurance, and activity tolerance required for successful completion of functional activities.  - Treadmill 1.2 mph 0% grade with CGA assistance. For improved lower extremity mobility, muscular endurance, and weightbearing activity tolerance; and to induce the analgesic effect of aerobic exercise, stimulate improved joint nutrition, and prepare body structures and systems for following interventions. x 6  minutes. 580.8 with BUE support.  - sit <> stand with 3kg blue ball toss at top (attempting to be explosive to throw up, catch, and sit). 2x10. Reports pain in knees, but enjoys challenge. Difficulty with coordination at first.    Neuromuscular Re-education: to improve, balance, postural strength, muscle activation patterns, and stabilization strength required for functional activities: - 6 inch hurdles (6 hurdles each pass): forward step together gait pattern, x 6 passes with each foot leading, x2 passes each side side stepping. CGA - min A for safety. Patient with difficulty coordinating movement and maintaining balance. Required seated rest 2 times during activity and prolonged seated rest following due to quick fatigue. - ambulation around clinic with no assistive device while "batting" very large theraball on the floor using a moderatly heavy metal pole, swinging both directions to improve core strength  and balance during functional ambulation. CGA for safety and no loss of balance that required clinician's physical support to prevent a fall. Patientmoreconfident this session. Very unsteady and unsafe to ambulate without guarding. Required constant advice to pick feet up and not scuff along floor.    PT Education - 09/10/18 1048    Education Details  Exercise purpose/form. Self management techniques    Person(s) Educated  Patient    Methods  Explanation;Demonstration;Verbal cues;Tactile cues    Comprehension  Verbalized understanding;Returned demonstration       PT Short Term Goals - 09/06/18 1622      PT SHORT TERM GOAL #1   Title  Be independent with initial home exercise program for self-management of symptoms.    Baseline  to be provided visit 2 (09/06/2018);    Time  2    Period  Weeks    Status  New    Target Date  09/20/18      PT SHORT TERM GOAL #2   Title  Improve 6MWT to equal or greater than 1200 feet  to demonstrate improved activity tolerance for community ambulation.    Baseline  1042 feet with no AD and CGA with multiple stumbles and grabbing onto things (09/06/2018);    Time  6    Period  Weeks    Status  New    Target Date  10/18/18      PT SHORT TERM GOAL #3   Title  Patient will improve 5TSTS test to equal or less than 20 seconds to demonstrate improved ability to complete transfers and move around home safely.    Baseline  27.5 with BUE support (09/06/2018);    Time  6    Period  Weeks    Status  New    Target Date  10/18/18      PT SHORT TERM GOAL #4   Title  Patient will improve by at least one point for each of the items on PSFS to show improvement in self-reported function:    Baseline  Walking: 7, moving after sleeping: 0, hip is better: 6 (09/06/2018);    Time  6    Period  Weeks    Status  New    Target Date  10/18/18        PT Long Term Goals - 09/06/18 1636      PT LONG TERM GOAL #1   Title  Be independent with a long-term home exercise  program for self-management of symptoms.    Baseline  initial HEP to be provided at visit 2 (09/06/2018);    Time  12    Period  Weeks    Status  New    Target Date  11/29/18      PT LONG TERM GOAL #2   Title  Improve 6MWT to equal or greater than 1400 feet to demonstrate improved activity tolerance for community ambulation.    Baseline  1042 feet with no AD and CGA with multiple stumbles and grabbing onto things (09/06/2018);    Time  12    Period  Weeks    Status  New    Target Date  11/29/18      PT LONG TERM GOAL #3   Title  Patient will improve 5TSTS test to equal or less than 20 seconds with no UE support to demonstrate improved ability to complete transfers and move around home safely.    Baseline  27.5 with BUE support (09/06/2018);    Time  12    Period  Weeks    Status  New    Target Date  10/18/18      PT LONG TERM GOAL #4   Title  Patient will improve to equal or greater than 8/10 on each of the items on PSFS to show improvement in self-reported function.    Baseline  Walking: 7, moving after sleeping: 0, hip is better: 6 (09/06/2018);    Time  12    Period  Weeks    Status  New    Target Date  11/29/18      PT LONG TERM GOAL #5   Title  Patient will score greater than 19/30 on Functional Gait Assessment to demonstrate moderate or low fall risk.    Baseline  measurement deferred due to fatigue (09/06/2018);    Time  12    Period  Weeks    Status  New    Target Date  11/01/18            Plan - 09/10/18 1057    Clinical Impression Statement  Pt tolerated treatment well but required frequent and prolonged rest breaks due to fatigue. Also demo excessive perspiration with sweat soaking through face covering by end of session. Required frequent re-direction to stay on task. Pt was able to complete all exercises with no reports of pain until end of session when he stated he had some bilateral posterior thigh pain that was mild. Continues to demonstrate tremor that is  worst at rest. Demo stooped posture during TM walking with BUE support. Readily flexes forward from waist during standing activities to reach ball or catch balance, sometimes to almost 90 degree angle. Very unsteady on feet and requires CGA - min A to prevent falls. Fails to pick feet up when walking and scuffs along the ground. Demo several failed attempts at sit <> stand due to failure to shift forward adequately but improved coordination with repetition. Struggled with ball toss/catch at first but able to complete more reliably after focused practice in standing at the start of sit <> stand exercise. Expressed frustration with his social situation at home including desire to leave it behind but did not describe anything concerning for neglect or abuse of himself. Pt required multimodal cuing for proper technique and to facilitate improved neuromuscular control, strength, range of motion, and functional ability resulting in improved performance and form. He declined suggestion to walk out to his vehicle with him for safety. Patient presents with significant balance, tremor, incoordination, pain, stiffness, activity tolerance, gait pattern, strength/endurance/power impairments that are limiting ability to complete ADLs, IADLs, housework, cooking, bathing, sleeping, transferring, walking household and community distances, stairs, community participation without difficulty and patient demonstrates high fall risk with multiple previous falls with injury. Patient would benefit from continued physical therapy to address remaining impairments and functional limitations to work towards stated goals and return to PLOF or maximal functional independence.    Personal Factors and Comorbidities  Age;Time since onset of injury/illness/exacerbation;Behavior Pattern;Comorbidity 3+;Fitness;Past/Current Experience    Comorbidities  a-fib (on warfarin), 6 weeks ago where he thought he was having a heart attack and he went to the  heart attack and it was determined to be A-fib, HTN, fatty liver (decreased drinking), hyperlipidemia, neuropathy in both legs (wears compression socks), denies "not really" history of back problems, Hx of skin cancer (removed on face), anxiety. Denies hx of stroke, MI, seizures, DM, or parkinson's.    Examination-Activity Limitations  Bathing;Hygiene/Grooming;Squat;Stairs;Lift;Bed Mobility;Bend;Locomotion Level;Stand;Caring for [employer];Toileting;Carry;Transfers;Dressing;Sleep;Other   difficulty with ADLs, IADLs, housework, cooking, bathing, sleeping, difficulty getting up from chair or floor, difficulty walking, high fall risk, frequent falls with injury.   Examination-Participation Restrictions  Shop;Cleaning;Community Activity;Meal Prep;Yard Work;Driving;Interpersonal Relationship    Stability/Clinical Decision Making  Evolving/Moderate complexity    Rehab Potential  Good    PT Frequency  2x / week    PT Duration  12 weeks    PT Treatment/Interventions  ADLs/Self Care Home Management;Aquatic Therapy;Canalith Repostioning;Cryotherapy;Moist Heat;Electrical Stimulation;Traction;DME Instruction;Gait training;Stair training;Functional mobility training;Therapeutic activities;Therapeutic exercise;Balance training;Neuromuscular re-education;Patient/family education;Manual techniques;Passive range of motion;Dry needling;Spinal Manipulations;Joint Manipulations;Other (comment)   joint mobilizations grades I-IV   PT Next Visit Plan  establish HEP, complete baseline FGA    PT Home Exercise Plan  to be established at visit 2    Consulted and Agree with Plan of Care  Patient       Patient will benefit from skilled therapeutic intervention in order to improve the following deficits and impairments:  Abnormal gait, Decreased knowledge of use of DME, Impaired sensation, Improper body mechanics, Pain, Decreased coordination, Decreased  mobility, Postural dysfunction, Decreased activity tolerance,  Decreased endurance, Decreased range of motion, Decreased strength, Hypomobility, Impaired perceived functional ability, Obesity, Impaired flexibility, Increased edema, Difficulty walking, Decreased safety awareness, Decreased balance  Visit Diagnosis: Muscle weakness (generalized)  Repeated falls  Tremor  Pain in right hip  Pain in left hip  Chronic bilateral low back pain with sciatica, sciatica laterality unspecified     Problem List Patient Active Problem List   Diagnosis Date Noted  . Hyperlipidemia 04/02/2018  . Essential hypertension 04/02/2018  . Atrial fibrillation ([place]) 04/02/2018  . Pain in limb 04/02/2018  . Personal history of tobacco use, presenting hazards to health 12/31/2017    Everlean Alstrom. Graylon Good, PT, DPT 09/10/18, 10:58 AM  [facility] [address] [place], [address] Phone: [phone]   Fax:  [phone]  Name: Jeremiah Bell MRN: [id number] Date of Birth: Nov 09, 1940

## 2018-09-10 ENCOUNTER — Encounter: Payer: Self-pay | Admitting: Physical Therapy

## 2018-09-14 ENCOUNTER — Ambulatory Visit: Payer: Medicare Other | Admitting: Physical Therapy

## 2018-09-16 ENCOUNTER — Ambulatory Visit: Payer: Medicare Other | Attending: Internal Medicine | Admitting: Physical Therapy

## 2018-09-16 ENCOUNTER — Other Ambulatory Visit: Payer: Self-pay

## 2018-09-16 ENCOUNTER — Encounter: Payer: Self-pay | Admitting: Physical Therapy

## 2018-09-16 DIAGNOSIS — M25551 Pain in right hip: Secondary | ICD-10-CM | POA: Insufficient documentation

## 2018-09-16 DIAGNOSIS — R251 Tremor, unspecified: Secondary | ICD-10-CM | POA: Diagnosis present

## 2018-09-16 DIAGNOSIS — R296 Repeated falls: Secondary | ICD-10-CM | POA: Diagnosis present

## 2018-09-16 DIAGNOSIS — M25552 Pain in left hip: Secondary | ICD-10-CM | POA: Insufficient documentation

## 2018-09-16 DIAGNOSIS — G8929 Other chronic pain: Secondary | ICD-10-CM | POA: Diagnosis present

## 2018-09-16 DIAGNOSIS — M5440 Lumbago with sciatica, unspecified side: Secondary | ICD-10-CM | POA: Insufficient documentation

## 2018-09-16 DIAGNOSIS — M6281 Muscle weakness (generalized): Secondary | ICD-10-CM | POA: Insufficient documentation

## 2018-09-16 NOTE — Therapy (Signed)
[facility] [address] [place], [address] Phone: [phone]   Fax:  [phone]  Physical Therapy Treatment  Patient Details  Name: Jeremiah Bell MRN: [id number] Date of Birth: April 20, 1940 Referring Provider (PT): Tracie Harrier, MD   Encounter Date: 09/16/2018  PT End of Session - 09/16/18 1509    Visit Number  3    Number of Visits  24    Date for PT Re-Evaluation  11/29/18    Authorization Type  Medicare reporting period from 09/06/2018    Authorization Time Period  Current Cert period: 0000000 - 11/29/2018 (latest PN: IE 09/06/2018);    Authorization - Visit Number  3    Authorization - Number of Visits  10    PT Start Time  [id number]    PT Stop Time  1600    PT Time Calculation (min)  46 min    Equipment Utilized During Treatment  Gait belt    Activity Tolerance  Patient tolerated treatment well;Patient limited by fatigue    Behavior During Therapy  WFL for tasks assessed/performed       Past Medical History:  Diagnosis Date  . A-fib ([place])   . Anxiety   . BPH (benign prostatic hyperplasia)   . GERD (gastroesophageal reflux disease)   . Hx of basal cell carcinoma 01/22/2015   L crown of scalp  . Hx of squamous cell carcinoma of skin 01/28/2016   L distal pretibial, R medial cheek  . Hyperlipidemia   . Hypertension   . Irritable bowel     Past Surgical History:  Procedure Laterality Date  . BASAL CELL CARCINOMA EXCISION Left 01/22/2015   crown of scalp  . COLONOSCOPY WITH PROPOFOL N/A 01/26/2018   Procedure: COLONOSCOPY WITH PROPOFOL;  Surgeon: Toledo, Benay Pike, MD;  Location: [facility] ENDOSCOPY;  Service: Gastroenterology;  Laterality: N/A;    There were no vitals filed for this visit.  Subjective Assessment - 09/16/18 1506    Subjective  Patient states he is feeling well and denis any pain upon arrival. Pt reports having cortisone shot yesterday at doctor's office at his L hip as  well as x-ray negative for fracture. Pt states he didn't sleep well recently.    Pertinent History  Patient is a 78 y.o. male who presents to outpatient physical therapy with a referral for medical diagnosis weakness of both lower extremities, ambulatory dysfunction, history of recent fall. This patient's chief complaints consist of pain in right hip and leg, LE weakness, difficulty walking, and impaired balance leading to the following functional deficits: difficulty with ADLs, IADLs, housework, cooking, bathing, sleeping, difficulty getting up from chair or floor, difficulty walking, high fall risk, frequent falls with injury. 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 Denies hx of stroke, MI, seizures, DM, or parkinson's.    Limitations  House hold activities;Lifting;Standing;Walking;Other (comment)   difficulty with ADLs, IADLs, housework, cooking, bathing, sleeping, difficulty getting up from chair or floor, difficulty walking, high fall risk, frequent falls with injury   How long can you sit comfortably?  not a major limitation    How long can you stand  comfortably?  depends on time of day, in the morning cannot walk for 40 min after attempting to get up.    How long can you walk comfortably?  depends on time of day, in the morning cannot walk for 40 min after attempting to get up.    Diagnostic tests  MRI R hip from 07/19/2018: "IMPRESSION: 1. Inflammation superficial and deep to the iliotibial band adjacent to the right greater trochanter. No defined fluid in the bursal space. 2. Diffuse degeneration of the superolateral and anterior aspects of the  right acetabular labrum consistent with intrasubstance tears. 3. 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    Currently in Pain?  No/denies    Pain Score  0-No pain    Pain Onset  More than a month ago   per documentation       TREATMENT:  Therapeutic exercise:to centralize symptoms and improve ROM, strength, muscular endurance, and activity tolerance required for successful completion of functional activities.  - NuStep level 4 without using bilateral upper and lower extremities. Setting 11. For improved extremity mobility, muscular endurance, and activity tolerance; and to induce the analgesic effect of aerobic exercise, stimulate improved joint nutrition, and prepare body structures and systems for following interventions. 7 minutes. 60 spm, 0.24mile.    - Sit <> stand: (to improve functional mobility and BLE strength/edurance to reduce fall risk)  With B UE support on chair 2x10    With B UE over lap 2x10 reps.   With B UE cross over chest 1x 10 reps.  Pt struggled due to lack of forward leaning cuing provided for nose-over-toes and body postures to pause at standing/sitting to prepare for next sit to stand.   Neuromuscular Re-education: to improve, balance, postural strength, muscle activation patterns, and stabilization strength required for functional activities:  - Obstacle courses set up containing (3 yellow hurdles 6 inches/4 orange cones) to increase step length and reduce foot dragging to reduce risk of fall and dynamic balance. 4 reps.   - Stepping x 3 ways (forward/sideway/backward) with single UE support on parallel bar. Cuing for wider base of support, appropriate step length (patient requires frequent cuing to complete tasks), and forward focusing gaze.   Education on HEP including handout   HOME EXERCISE PROGRAM Access Code: [facility]  URL:  [email or web address]  Date: 09/16/2018  Prepared by: Rosita Kea    Exercises Sit to Stand- 2 sets- 5 reps- 2x daily- 7x weekly  Side Stepping with Counter Support- 2 sets- 10 reps- 2x daily- 7x weekly      PT Education - 09/16/18 1639    Education Details  Exercise purpose/form. Self management techniques    Person(s) Educated  Patient    Methods  Explanation;Demonstration;Tactile cues;Verbal cues    Comprehension  Verbalized understanding;Verbal cues required;Returned demonstration;Tactile cues required       PT Short Term Goals - 09/06/18 1622      PT SHORT TERM GOAL #1   Title  Be independent with initial home exercise program for self-management of symptoms.    Baseline  to be provided visit 2 (09/06/2018);    Time  2    Period  Weeks    Status  New    Target Date  09/20/18      PT SHORT TERM GOAL #2   Title  Improve 6MWT to equal or greater  than 1200 feet to demonstrate improved activity tolerance for community ambulation.    Baseline  1042 feet with no AD and CGA with multiple stumbles and grabbing onto things (09/06/2018);    Time  6    Period  Weeks    Status  New    Target Date  10/18/18      PT SHORT TERM GOAL #3   Title  Patient will improve 5TSTS test to equal or less than 20 seconds to demonstrate improved ability to complete transfers and move around home safely.    Baseline  27.5 with BUE support (09/06/2018);    Time  6    Period  Weeks    Status  New    Target Date  10/18/18      PT SHORT TERM GOAL #4   Title  Patient will improve by at least one point for each of the items on PSFS to show improvement in self-reported function:    Baseline  Walking: 7, moving after sleeping: 0, hip is better: 6 (09/06/2018);    Time  6    Period  Weeks    Status  New    Target Date  10/18/18        PT Long Term Goals - 09/06/18 1636      PT LONG TERM GOAL #1   Title  Be independent with a long-term home exercise program for self-management of  symptoms.    Baseline  initial HEP to be provided at visit 2 (09/06/2018);    Time  12    Period  Weeks    Status  New    Target Date  11/29/18      PT LONG TERM GOAL #2   Title  Improve 6MWT to equal or greater than 1400 feet to demonstrate improved activity tolerance for community ambulation.    Baseline  1042 feet with no AD and CGA with multiple stumbles and grabbing onto things (09/06/2018);    Time  12    Period  Weeks    Status  New    Target Date  11/29/18      PT LONG TERM GOAL #3   Title  Patient will improve 5TSTS test to equal or less than 20 seconds with no UE support to demonstrate improved ability to complete transfers and move around home safely.    Baseline  27.5 with BUE support (09/06/2018);    Time  12    Period  Weeks    Status  New    Target Date  10/18/18      PT LONG TERM GOAL #4   Title  Patient will improve to equal or greater than 8/10 on each of the items on PSFS to show improvement in self-reported function.    Baseline  Walking: 7, moving after sleeping: 0, hip is better: 6 (09/06/2018);    Time  12    Period  Weeks    Status  New    Target Date  11/29/18      PT LONG TERM GOAL #5   Title  Patient will score greater than 19/30 on Functional Gait Assessment to demonstrate moderate or low fall risk.    Baseline  measurement deferred due to fatigue (09/06/2018);    Time  12    Period  Weeks    Status  New    Target Date  11/01/18            Plan - 09/16/18 1641  Clinical Impression Statement  Patient tolerated well at today's session and showing improvement of floor clearance at the end of the session but requires minimal cuing. Patient required CGA- min A for standing activities without the bar. Delayed motor activation noted and extensive cuing provided to keep patient focus on task. HEP given and pt demonstrated good understanding with HEP instructions but again requires extensive cuing. Will continue progress dynamic balance as well as foot  clearance in future session. Patient presents with significant balance, tremor, incoordination, pain, stiffness, activity tolerance, gait pattern, strength/endurance/power impairments that are limiting ability to complete ADLs, IADLs, housework, cooking, bathing, sleeping, transferring, walking household and community distances, stairs, community participation without difficulty and patient demonstrates high fall risk with multiple previous falls with injury. Patient would benefit from continued physical therapy to address remaining impairments and functional limitations to work towards stated goals and return to PLOF or maximal functional independence.    Personal Factors and Comorbidities  Age;Time since onset of injury/illness/exacerbation;Behavior Pattern;Comorbidity 3+;Fitness;Past/Current Experience    Comorbidities  a-fib (on warfarin), 6 weeks ago where he thought he was having a heart attack and he went to the heart attack and it was determined to be A-fib, HTN, fatty liver (decreased drinking), hyperlipidemia, neuropathy in both legs (wears compression socks), denies "not really" history of back problems, Hx of skin cancer (removed on face), anxiety. Denies hx of stroke, MI, seizures, DM, or parkinson's.    Examination-Activity Limitations  Bathing;Hygiene/Grooming;Squat;Stairs;Lift;Bed Mobility;Bend;Locomotion Level;Stand;Caring for [employer];Toileting;Carry;Transfers;Dressing;Sleep;Other   difficulty with ADLs, IADLs, housework, cooking, bathing, sleeping, difficulty getting up from chair or floor, difficulty walking, high fall risk, frequent falls with injury.   Examination-Participation Restrictions  Shop;Cleaning;Community Activity;Meal Prep;Yard Work;Driving;Interpersonal Relationship    Stability/Clinical Decision Making  Evolving/Moderate complexity    Rehab Potential  Good    PT Frequency  2x / week    PT Duration  12 weeks    PT Treatment/Interventions  ADLs/Self Care Home  Management;Aquatic Therapy;Canalith Repostioning;Cryotherapy;Moist Heat;Electrical Stimulation;Traction;DME Instruction;Gait training;Stair training;Functional mobility training;Therapeutic activities;Therapeutic exercise;Balance training;Neuromuscular re-education;Patient/family education;Manual techniques;Passive range of motion;Dry needling;Spinal Manipulations;Joint Manipulations;Other (comment)   joint mobilizations grades I-IV   PT Next Visit Plan  establish HEP, complete baseline FGA    PT Home Exercise Plan  to be established at visit 2    Consulted and Agree with Plan of Care  Patient       Patient will benefit from skilled therapeutic intervention in order to improve the following deficits and impairments:  Abnormal gait, Decreased knowledge of use of DME, Impaired sensation, Improper body mechanics, Pain, Decreased coordination, Decreased mobility, Postural dysfunction, Decreased activity tolerance, Decreased endurance, Decreased range of motion, Decreased strength, Hypomobility, Impaired perceived functional ability, Obesity, Impaired flexibility, Increased edema, Difficulty walking, Decreased safety awareness, Decreased balance  Visit Diagnosis: Muscle weakness (generalized)  Repeated falls  Tremor  Pain in right hip  Pain in left hip  Chronic bilateral low back pain with sciatica, sciatica laterality unspecified     Problem List Patient Active Problem List   Diagnosis Date Noted  . Hyperlipidemia 04/02/2018  . Essential hypertension 04/02/2018  . Atrial fibrillation ([place]) 04/02/2018  . Pain in limb 04/02/2018  . Personal history of tobacco use, presenting hazards to health 12/31/2017   Sherrilyn Rist, SPT 09/16/18, 5:56 PM    [facility] [address] [place], [address] Phone: [phone]   Fax:  [phone]  Name: Jeremiah Bell MRN: [id number] Date of Birth: 1940-07-13

## 2018-09-22 ENCOUNTER — Ambulatory Visit: Payer: Medicare Other

## 2018-09-22 ENCOUNTER — Encounter: Payer: Self-pay | Admitting: Physical Therapy

## 2018-09-22 ENCOUNTER — Other Ambulatory Visit: Payer: Self-pay

## 2018-09-22 DIAGNOSIS — M25552 Pain in left hip: Secondary | ICD-10-CM

## 2018-09-22 DIAGNOSIS — M25551 Pain in right hip: Secondary | ICD-10-CM

## 2018-09-22 DIAGNOSIS — M6281 Muscle weakness (generalized): Secondary | ICD-10-CM | POA: Diagnosis not present

## 2018-09-22 DIAGNOSIS — G8929 Other chronic pain: Secondary | ICD-10-CM

## 2018-09-22 DIAGNOSIS — R296 Repeated falls: Secondary | ICD-10-CM

## 2018-09-22 DIAGNOSIS — R251 Tremor, unspecified: Secondary | ICD-10-CM

## 2018-09-22 NOTE — Therapy (Signed)
[facility] [address] [place], [address] Phone: [phone]   Fax:  [phone]  Physical Therapy Treatment  Patient Details  Name: Jeremiah Bell MRN: [id number] Date of Birth: 06-27-1940 Referring Provider (PT): Tracie Harrier, MD   Encounter Date: 09/22/2018  PT End of Session - 09/22/18 1432    Visit Number  4    Number of Visits  24    Date for PT Re-Evaluation  11/29/18    Authorization Type  Medicare reporting period from 09/06/2018    Authorization Time Period  Current Cert period: 0000000 - 11/29/2018 (latest PN: IE 09/06/2018);    Authorization - Visit Number  4    Authorization - Number of Visits  10    PT Start Time  [id number]    PT Stop Time  1430    PT Time Calculation (min)  45 min    Equipment Utilized During Treatment  Gait belt    Activity Tolerance  Patient tolerated treatment well;Patient limited by fatigue    Behavior During Therapy  WFL for tasks assessed/performed       Past Medical History:  Diagnosis Date  . A-fib ([place])   . Anxiety   . BPH (benign prostatic hyperplasia)   . GERD (gastroesophageal reflux disease)   . Hx of basal cell carcinoma 01/22/2015   L crown of scalp  . Hx of squamous cell carcinoma of skin 01/28/2016   L distal pretibial, R medial cheek  . Hyperlipidemia   . Hypertension   . Irritable bowel     Past Surgical History:  Procedure Laterality Date  . BASAL CELL CARCINOMA EXCISION Left 01/22/2015   crown of scalp  . COLONOSCOPY WITH PROPOFOL N/A 01/26/2018   Procedure: COLONOSCOPY WITH PROPOFOL;  Surgeon: Toledo, Benay Pike, MD;  Location: [facility] ENDOSCOPY;  Service: Gastroenterology;  Laterality: N/A;    There were no vitals filed for this visit.  Subjective Assessment - 09/22/18 1352    Subjective  Patient states 6/10 pain in the R posterior hip. He was not able to sleep on his bed flat and has been sleeping on the recliner in the past month.  No fall since last treatment. Uable to complete HEP at home due to R hip pain. Patient states his bilateral feet feels numbness and tingling all the time however, he has been actively seeing doctors. Patient also states at the beginning of the session that he has stomach discomfort and may need to use restroom during the session.   Simultaneous filing. User may not have seen previous data.   Pertinent History  Patient is a 78 y.o. male who presents to outpatient physical therapy with a referral for medical diagnosis weakness of both lower extremities, ambulatory dysfunction, history of recent fall. This patient's chief complaints consist of pain in right hip and leg, LE weakness, difficulty walking, and impaired balance leading to the following functional deficits: difficulty with ADLs, IADLs, housework, cooking, bathing, sleeping, difficulty getting up from chair or floor, difficulty walking, high fall risk, frequent falls with injury. 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 Denies hx of stroke, MI, seizures,  DM, or parkinson's.    Limitations  House hold activities;Lifting;Standing;Walking;Other (comment)   difficulty with ADLs, IADLs, housework, cooking, bathing, sleeping, difficulty getting up from chair or floor, difficulty walking, high fall risk, frequent falls with injury   How long can you sit comfortably?  not a major limitation    How long can you stand comfortably?  depends on time of day, in the morning cannot walk for 40 min after attempting to get up.    How long can you walk comfortably?  depends on time of day, in the morning cannot walk  for 40 min after attempting to get up.    Diagnostic tests  MRI R hip from 07/19/2018: "IMPRESSION: 1. Inflammation superficial and deep to the iliotibial band adjacent to the right greater trochanter. No defined fluid in the bursal space. 2. Diffuse degeneration of the superolateral and anterior aspects of the right acetabular labrum consistent with intrasubstance tears. 3. 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    Pain Score  6     Pain Location  Hip    Pain Orientation  Posterior    Pain Descriptors / Indicators  Shooting   per prior PT notes: intolerable when he put weight on R leg. Feels inside leg. Hurts so bad it may be a shooting or hurting pain. Will make a grown man cry in the shower. Get better in 30-40 minutes to get straight laying on the floor.   Pain Type  Acute pain;Chronic pain    Pain Onset  More than a month ago   per documentation      TREATMENT:  SBA to CGA provided throughout the session.   Therapeutic exercise:to centralize symptoms and improve ROM, strength, muscular endurance, and activity tolerance required for successful completion of functional activities. -NuStep level 4 without using bilateral upper and lower extremities. Setting 11. For improved extremity mobility, muscular endurance, and activity tolerance; and to induce the analgesic effect of aerobic exercise, stimulate improved joint nutrition, and prepare body structures and systems for following interventions. 7 minutes. 45 spm, 0.14mile.   - Sit <> stand: (to improve functional mobility and BLE strength/edurance to reduce fall risk)  With B UE cross over chest, 2x10 reps  With B UE holding yellow medicine ball, 1x10 reps.   With B UE holding yellow medicine ball and lift ball over head once standing, 1x10 reps. Pt showing difficulty anterior weight shift when prepare to stand and improved  initiation of standing without verbal cuing.  - Step up/down stairs with unilateral UE support on railing to improve hip flexor strength and foot clearance to reduce the fall risk and R hip pain. 2x10 reps for each side R/L leading.   Neuromuscular Re-education: to improve, balance, postural strength, muscle activation patterns, and stabilization strength required for functional activities: - Obstacle courses set up containing (4 yellow hurdles 6 inches) to  reduce foot dragging to reduce risk of fall and dynamic balance. 5 reps.  Patient showing lack of R hip flexion > L hip. Cuing provided for foot placement to improve foot clearance.  - Stepping x 3 ways (forward/sideway/backward) with single UE support on (1rep)and off (1rep) stationary bar. Cuing for wider base of support, appropriate step length (patient requires frequent cuing to complete tasks), and forward focusing gaze. 2x10 steps each directions. Increased postural sway when hand off from  stationary bar.   Patient educated on importance of HEP and patient states he will try HEP at home but he will be busy because he has multiple doctor's visit during the rest of the week   HOME EXERCISE PROGRAM Access Code: [facility]  URL: [email or web address]  Date: 09/16/2018  Prepared by: Rosita Kea    Exercises Sit to Stand- 2 sets- 5 reps- 2x daily- 7x weekly  Side Stepping with Counter Support- 2 sets- 10 reps- 2x daily- 7x weekly   Patient tolerated the session well and states 0/10 with his R hip pain at the end of the treatment. Patient demonstrated improved step length and single leg standing balance with stepping practice but when progressed with decreased upper extremity support, patient presents with increase postural instability, decreased confidence, and decreased step length. Patient has been consistently showing difficulties with R foot clearance due to lack of R hip flexor activation. Patient also showed increased  tremor and moderate postural sway when walking through the door at the beginning of the session but demonstrated reduced postural sway, improved gait and balance at the end of the session. Addressed HEP importance and encouraged HEP compliance. Patient's vital assessed on room air. SpO2/HR are WNL(patient does have history of a fib). Will continue strength, neuro-reeducation, HEP in upcoming session to improve postural stability, reduce fall risk, and pain control at R hip.     PT Short Term Goals - 09/06/18 1622      PT SHORT TERM GOAL #1   Title  Be independent with initial home exercise program for self-management of symptoms.    Baseline  to be provided visit 2 (09/06/2018);    Time  2    Period  Weeks    Status  New    Target Date  09/20/18      PT SHORT TERM GOAL #2   Title  Improve 6MWT to equal or greater than 1200 feet to demonstrate improved activity tolerance for community ambulation.    Baseline  1042 feet with no AD and CGA with multiple stumbles and grabbing onto things (09/06/2018);    Time  6    Period  Weeks    Status  New    Target Date  10/18/18      PT SHORT TERM GOAL #3   Title  Patient will improve 5TSTS test to equal or less than 20 seconds to demonstrate improved ability to complete transfers and move around home safely.    Baseline  27.5 with BUE support (09/06/2018);    Time  6    Period  Weeks    Status  New    Target Date  10/18/18      PT SHORT TERM GOAL #4   Title  Patient will improve by at least one point for each of the items on PSFS to show improvement in self-reported function:    Baseline  Walking: 7, moving after sleeping: 0, hip is better: 6 (09/06/2018);    Time  6    Period  Weeks    Status  New    Target Date  10/18/18        PT Long Term Goals - 09/06/18 1636      PT LONG TERM GOAL #1   Title  Be independent with a long-term home exercise program for self-management of symptoms.    Baseline  initial HEP to be provided at visit 2  (09/06/2018);    Time  12    Period  Weeks  Status  New    Target Date  11/29/18      PT LONG TERM GOAL #2   Title  Improve 6MWT to equal or greater than 1400 feet to demonstrate improved activity tolerance for community ambulation.    Baseline  1042 feet with no AD and CGA with multiple stumbles and grabbing onto things (09/06/2018);    Time  12    Period  Weeks    Status  New    Target Date  11/29/18      PT LONG TERM GOAL #3   Title  Patient will improve 5TSTS test to equal or less than 20 seconds with no UE support to demonstrate improved ability to complete transfers and move around home safely.    Baseline  27.5 with BUE support (09/06/2018);    Time  12    Period  Weeks    Status  New    Target Date  10/18/18      PT LONG TERM GOAL #4   Title  Patient will improve to equal or greater than 8/10 on each of the items on PSFS to show improvement in self-reported function.    Baseline  Walking: 7, moving after sleeping: 0, hip is better: 6 (09/06/2018);    Time  12    Period  Weeks    Status  New    Target Date  11/29/18      PT LONG TERM GOAL #5   Title  Patient will score greater than 19/30 on Functional Gait Assessment to demonstrate moderate or low fall risk.    Baseline  measurement deferred due to fatigue (09/06/2018);    Time  12    Period  Weeks    Status  New    Target Date  11/01/18            Plan - 09/22/18 1513    Clinical Impression Statement  Patient tolerated the session well and states 0/10 with his R hip pain at the end of the treatment. Patient demonstrated improved step length and single leg standing balance with stepping practice but when progressed with decreased upper extremity support, patient presents with increase postural instability, decreased confidence, and decreased step length. Patient has been consistently showing difficulties with R foot clearance due to lack of R hip flexor activation. Patient also showed increased tremor and moderate  postural sway when walking through the door at the beginning of the session but demonstrated reduced postural sway, improved gait and balance at the end of the session. Addressed HEP importance and encouraged HEP compliance. Patient's vital assessed on room air. SpO2/HR are WNL(patient does have history of a fib). Will continue strength, neuro-reeducation, HEP in upcoming session to improve postural stability, reduce fall risk, and pain control at R hip.    Personal Factors and Comorbidities  Age;Time since onset of injury/illness/exacerbation;Behavior Pattern;Comorbidity 3+;Fitness;Past/Current Experience    Comorbidities  a-fib (on warfarin), 6 weeks ago where he thought he was having a heart attack and he went to the heart attack and it was determined to be A-fib, HTN, fatty liver (decreased drinking), hyperlipidemia, neuropathy in both legs (wears compression socks), denies "not really" history of back problems, Hx of skin cancer (removed on face), anxiety. Denies hx of stroke, MI, seizures, DM, or parkinson's.    Examination-Activity Limitations  Bathing;Hygiene/Grooming;Squat;Stairs;Lift;Bed Mobility;Bend;Locomotion Level;Stand;Caring for [employer];Toileting;Carry;Transfers;Dressing;Sleep;Other   difficulty with ADLs, IADLs, housework, cooking, bathing, sleeping, difficulty getting up from chair or floor, difficulty walking, high fall risk, frequent  falls with injury.   Examination-Participation Restrictions  Shop;Cleaning;Community Activity;Meal Prep;Yard Work;Driving;Interpersonal Relationship    Stability/Clinical Decision Making  Evolving/Moderate complexity    Rehab Potential  Good    PT Frequency  2x / week    PT Duration  12 weeks    PT Treatment/Interventions  ADLs/Self Care Home Management;Aquatic Therapy;Canalith Repostioning;Cryotherapy;Moist Heat;Electrical Stimulation;Traction;DME Instruction;Gait training;Stair training;Functional mobility training;Therapeutic  activities;Therapeutic exercise;Balance training;Neuromuscular re-education;Patient/family education;Manual techniques;Passive range of motion;Dry needling;Spinal Manipulations;Joint Manipulations;Other (comment)   joint mobilizations grades I-IV   PT Next Visit Plan  establish HEP, complete baseline FGA    PT Home Exercise Plan  to be established at visit 2    Consulted and Agree with Plan of Care  Patient       Patient will benefit from skilled therapeutic intervention in order to improve the following deficits and impairments:  Abnormal gait, Decreased knowledge of use of DME, Impaired sensation, Improper body mechanics, Pain, Decreased coordination, Decreased mobility, Postural dysfunction, Decreased activity tolerance, Decreased endurance, Decreased range of motion, Decreased strength, Hypomobility, Impaired perceived functional ability, Obesity, Impaired flexibility, Increased edema, Difficulty walking, Decreased safety awareness, Decreased balance  Visit Diagnosis: Muscle weakness (generalized)  Repeated falls  Tremor  Pain in right hip  Pain in left hip  Chronic bilateral low back pain with sciatica, sciatica laterality unspecified     Problem List Patient Active Problem List   Diagnosis Date Noted  . Hyperlipidemia 04/02/2018  . Essential hypertension 04/02/2018  . Atrial fibrillation ([place]) 04/02/2018  . Pain in limb 04/02/2018  . Personal history of tobacco use, presenting hazards to health 12/31/2017   Sherrilyn Rist, SPT 09/22/18, 5:21 PM    [facility] [address] [place], [address] Phone: [phone]   Fax:  [phone]  Name: Jeremiah Bell MRN: [id number] Date of Birth: Apr 02, 1940

## 2018-09-27 ENCOUNTER — Other Ambulatory Visit: Payer: Self-pay

## 2018-09-27 ENCOUNTER — Ambulatory Visit: Payer: Medicare Other | Admitting: Physical Therapy

## 2018-09-27 ENCOUNTER — Encounter: Payer: Self-pay | Admitting: Physical Therapy

## 2018-09-27 DIAGNOSIS — M25551 Pain in right hip: Secondary | ICD-10-CM

## 2018-09-27 DIAGNOSIS — M5441 Lumbago with sciatica, right side: Secondary | ICD-10-CM

## 2018-09-27 DIAGNOSIS — M6281 Muscle weakness (generalized): Secondary | ICD-10-CM

## 2018-09-27 DIAGNOSIS — R296 Repeated falls: Secondary | ICD-10-CM

## 2018-09-27 DIAGNOSIS — R251 Tremor, unspecified: Secondary | ICD-10-CM

## 2018-09-27 DIAGNOSIS — G8929 Other chronic pain: Secondary | ICD-10-CM

## 2018-09-27 DIAGNOSIS — M25552 Pain in left hip: Secondary | ICD-10-CM

## 2018-09-27 NOTE — Therapy (Signed)
[facility] [address], [place], [address] Phone: [phone]   Fax:  [phone]  Physical Therapy Treatment  Patient Details  Name: Jeremiah Bell MRN: [id number] Date of Birth: Apr 16, 1940 Referring Provider (PT): Tracie Harrier, MD   Encounter Date: 09/27/2018  PT End of Session - 09/27/18 1441    Visit Number  5    Number of Visits  24    Date for PT Re-Evaluation  11/29/18    Authorization Type  Medicare reporting period from 09/06/2018    Authorization Time Period  Current Cert period: 0000000 - 11/29/2018 (latest PN: IE 09/06/2018);    Authorization - Visit Number  5    Authorization - Number of Visits  10    PT Start Time  [id number]    PT Stop Time  1435    PT Time Calculation (min)  43 min    Equipment Utilized During Treatment  Gait belt    Activity Tolerance  Patient tolerated treatment well;Patient limited by fatigue    Behavior During Therapy  WFL for tasks assessed/performed       Past Medical History:  Diagnosis Date  . A-fib ([facility])   . Anxiety   . BPH (benign prostatic hyperplasia)   . GERD (gastroesophageal reflux disease)   . Hx of basal cell carcinoma 01/22/2015   L crown of scalp  . Hx of squamous cell carcinoma of skin 01/28/2016   L distal pretibial, R medial cheek  . Hyperlipidemia   . Hypertension   . Irritable bowel     Past Surgical History:  Procedure Laterality Date  . BASAL CELL CARCINOMA EXCISION Left 01/22/2015   crown of scalp  . COLONOSCOPY WITH PROPOFOL N/A 01/26/2018   Procedure: COLONOSCOPY WITH PROPOFOL;  Surgeon: Toledo, Benay Pike, MD;  Location: [facility] ENDOSCOPY;  Service: Gastroenterology;  Laterality: N/A;    There were no vitals filed for this visit.  Subjective Assessment - 09/27/18 1359    Subjective  Patient states 0/10 pain in the R posterior hip. L elbow pain because he hitted somewhere and has brace on for pain control. No recent falls.    Pertinent History  Patient is a 78 y.o. male who presents to outpatient physical therapy with a referral for medical diagnosis weakness of both lower extremities, ambulatory dysfunction, history of recent fall. This patient's chief complaints consist of pain in right hip and leg, LE weakness, difficulty walking, and impaired balance leading to the following functional deficits: difficulty with ADLs, IADLs, housework, cooking, bathing, sleeping, difficulty getting up from chair or floor, difficulty walking, high fall risk, frequent falls with injury. 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 Denies hx of stroke, MI, seizures, DM, or parkinson's.    Limitations  House hold activities;Lifting;Standing;Walking;Other (comment)   difficulty with ADLs, IADLs, housework, cooking, bathing, sleeping, difficulty getting up from chair or floor, difficulty walking, high fall risk, frequent falls with injury   How long can you sit comfortably?  not a major limitation    How long can you stand comfortably?  depends on time of day, in the morning cannot walk for 40  min after attempting to get up.    How long can you walk comfortably?  depends on time of day, in the morning cannot walk for 40 min after attempting to get up.    Diagnostic tests  MRI R hip from 07/19/2018: "IMPRESSION: 1. Inflammation superficial and deep to the iliotibial band adjacent to the right greater trochanter. No defined fluid in the bursal space. 2. Diffuse degeneration of the superolateral and anterior aspects of the right acetabular labrum consistent with intrasubstance tears. 3. Disc protrusion at  L4-5 on the right noted on CT scan dated 02/01/2018."    Patient Stated Goals  get stronger and get rid of the pain in his hip.    Pain Score  0-No pain    Pain Location  Hip    Pain Onset  More than a month ago   per documentation      TREATMENT:    SBA to CGA provided throughout the session.    Therapeutic exercise: to centralize symptoms and improve ROM, strength, muscular endurance, and activity tolerance required for successful completion of functional activities.  - NuStep level 2 (31min) 3(34min) 5(110min) without using bilateral upper. Setting 11. For improved extremity mobility, muscular endurance, and activity tolerance; and to induce the analgesic effect of aerobic exercise, stimulate improved joint nutrition, and prepare body structures and systems for following interventions. 7 minutes. 49 spm, 0.19 mile. Cuing to improve SPM up to 60 and soft end control with stepping.   - Sit <> stand: (to improve functional mobility and BLE strength/edurance to reduce fall risk)  With B UE cross over chest, 2x10 reps  With B UE toss and catch medium yellow physio ball, 1x10 reps.   - Step up/down stairs with R UE support on railing to improve hip flexor strength and foot clearance to reduce the fall risk and R hip pain. 2x10 reps for each side R/L leading. Cuing for L LE hip flexor activation and foot dorsiflexion.   - Ambulation 150 feet with CGA from clinic to patient's car using SPC. Cuing to improve BLE hip flexor activation and BLE dorsiflexion for floor clearance.   Neuromuscular Re-education: to improve, balance, postural strength, muscle activation patterns, and stabilization strength required for functional activities: - Obstacle courses set up containing (5 yellow hurdles 6 inches) to reduce foot dragging to reduce risk of fall and dynamic balance. 6 reps each side.  Patient showing lack of L hip flexion > R hip. Cuing provided for L hip flexion to improve L foot clearance.  -  Stepping x 3 ways (forward/sideway/backward) with single UE support on stationary bar. Cuing for wider base of support, appropriate step length (patient requires frequent cuing to complete tasks), and forward focusing gaze. 1x10 steps each directions.   Patient educated on importance of HEP and patient states he will try HEP at home but he will be busy because he has multiple doctor's visit during the rest of the week    HOME EXERCISE PROGRAM Access Code: [facility]  URL: [email or web address]  Date: 09/16/2018  Prepared by: Rosita Kea     Exercises Sit to Stand- 2 sets- 5 reps- 2x daily- 7x weekly  Side Stepping with Counter Support- 2 sets- 10 reps- 2x daily- 7x weekly    Patient tolerated the session well and states minor increase of muscle soreness R hip pain at the end of the treatment. Patient demonstrated a great improvement with dynamic balance and LE strength with sit to stand exercise. Patient also showing  decreased postural sway with stepping exercise as well as big steps showing. However, patient still requires extensive cuing to improve foot clearance L worse than R. Will progress L hip flexor strengthening and balance in later session. Will continue strength, neuro-reeducation, HEP in upcoming session to improve postural stability, reduce fall risk, and pain control at R hip.         PT Education - 09/27/18 1441    Education Details  Exercise purpose/form. Self management techniques    Person(s) Educated  Patient    Methods  Explanation;Tactile cues;Demonstration;Verbal cues    Comprehension  Verbalized understanding;Returned demonstration;Verbal cues required;Tactile cues required       PT Short Term Goals - 09/27/18 1619      PT SHORT TERM GOAL #1   Title  Be independent with initial home exercise program for self-management of symptoms.    Baseline  to be provided visit 2 (09/06/2018);    Time  2    Period  Weeks    Status  Achieved    Target Date   09/20/18      PT SHORT TERM GOAL #2   Title  Improve 6MWT to equal or greater than 1200 feet to demonstrate improved activity tolerance for community ambulation.    Baseline  1042 feet with no AD and CGA with multiple stumbles and grabbing onto things (09/06/2018);    Time  6    Period  Weeks    Status  New    Target Date  10/18/18      PT SHORT TERM GOAL #3   Title  Patient will improve 5TSTS test to equal or less than 20 seconds to demonstrate improved ability to complete transfers and move around home safely.    Baseline  27.5 with BUE support (09/06/2018);    Time  6    Period  Weeks    Status  New    Target Date  10/18/18      PT SHORT TERM GOAL #4   Title  Patient will improve by at least one point for each of the items on PSFS to show improvement in self-reported function:    Baseline  Walking: 7, moving after sleeping: 0, hip is better: 6 (09/06/2018);    Time  6    Period  Weeks    Status  New    Target Date  10/18/18        PT Long Term Goals - 09/06/18 1636      PT LONG TERM GOAL #1   Title  Be independent with a long-term home exercise program for self-management of symptoms.    Baseline  initial HEP to be provided at visit 2 (09/06/2018);    Time  12    Period  Weeks    Status  New    Target Date  11/29/18      PT LONG TERM GOAL #2   Title  Improve 6MWT to equal or greater than 1400 feet to demonstrate improved activity tolerance for community ambulation.    Baseline  1042 feet with no AD and CGA with multiple stumbles and grabbing onto things (09/06/2018);    Time  12    Period  Weeks    Status  New    Target Date  11/29/18      PT LONG TERM GOAL #3   Title  Patient will improve 5TSTS test to equal or less than 20 seconds with no UE support to demonstrate improved ability to complete  transfers and move around home safely.    Baseline  27.5 with BUE support (09/06/2018);    Time  12    Period  Weeks    Status  New    Target Date  10/18/18      PT LONG  TERM GOAL #4   Title  Patient will improve to equal or greater than 8/10 on each of the items on PSFS to show improvement in self-reported function.    Baseline  Walking: 7, moving after sleeping: 0, hip is better: 6 (09/06/2018);    Time  12    Period  Weeks    Status  New    Target Date  11/29/18      PT LONG TERM GOAL #5   Title  Patient will score greater than 19/30 on Functional Gait Assessment to demonstrate moderate or low fall risk.    Baseline  measurement deferred due to fatigue (09/06/2018);    Time  12    Period  Weeks    Status  New    Target Date  11/01/18            Plan - 09/27/18 1442    Clinical Impression Statement  Patient tolerated the session well and states minor increase of muscle soreness R hip pain at the end of the treatment. Patient demonstrated a great improvement with dynamic balance and LE strength with sit to stand exercise. Patient also showing decreased postural sway with stepping exercise as well as big steps showing. However, patient still requires extensive cuing to improve foot clearance L worse than R. Will progress L hip flexor strengthening and balance in later session. Will continue strength, neuro-reeducation, HEP in upcoming session to improve postural stability, reduce fall risk, and pain control at R hip.    Personal Factors and Comorbidities  Age;Time since onset of injury/illness/exacerbation;Behavior Pattern;Comorbidity 3+;Fitness;Past/Current Experience    Comorbidities  a-fib (on warfarin), 6 weeks ago where he thought he was having a heart attack and he went to the heart attack and it was determined to be A-fib, HTN, fatty liver (decreased drinking), hyperlipidemia, neuropathy in both legs (wears compression socks), denies "not really" history of back problems, Hx of skin cancer (removed on face), anxiety. Denies hx of stroke, MI, seizures, DM, or parkinson's.    Examination-Activity Limitations   Bathing;Hygiene/Grooming;Squat;Stairs;Lift;Bed Mobility;Bend;Locomotion Level;Stand;Caring for [employer];Toileting;Carry;Transfers;Dressing;Sleep;Other   difficulty with ADLs, IADLs, housework, cooking, bathing, sleeping, difficulty getting up from chair or floor, difficulty walking, high fall risk, frequent falls with injury.   Examination-Participation Restrictions  Shop;Cleaning;Community Activity;Meal Prep;Yard Work;Driving;Interpersonal Relationship    Stability/Clinical Decision Making  Evolving/Moderate complexity    Rehab Potential  Good    PT Frequency  2x / week    PT Duration  12 weeks    PT Treatment/Interventions  ADLs/Self Care Home Management;Aquatic Therapy;Canalith Repostioning;Cryotherapy;Moist Heat;Electrical Stimulation;Traction;DME Instruction;Gait training;Stair training;Functional mobility training;Therapeutic activities;Therapeutic exercise;Balance training;Neuromuscular re-education;Patient/family education;Manual techniques;Passive range of motion;Dry needling;Spinal Manipulations;Joint Manipulations;Other (comment)   joint mobilizations grades I-IV   PT Next Visit Plan  establish HEP, complete baseline FGA    PT Home Exercise Plan  to be established at visit 2    Consulted and Agree with Plan of Care  Patient       Patient will benefit from skilled therapeutic intervention in order to improve the following deficits and impairments:  Abnormal gait, Decreased knowledge of use of DME, Impaired sensation, Improper body mechanics, Pain, Decreased coordination, Decreased mobility, Postural dysfunction, Decreased activity tolerance, Decreased endurance, Decreased  range of motion, Decreased strength, Hypomobility, Impaired perceived functional ability, Obesity, Impaired flexibility, Increased edema, Difficulty walking, Decreased safety awareness, Decreased balance  Visit Diagnosis: Muscle weakness (generalized)  Repeated falls  Tremor  Pain in right hip  Pain  in left hip  Chronic bilateral low back pain with sciatica, sciatica laterality unspecified     Problem List Patient Active Problem List   Diagnosis Date Noted  . Hyperlipidemia 04/02/2018  . Essential hypertension 04/02/2018  . Atrial fibrillation ([place]) 04/02/2018  . Pain in limb 04/02/2018  . Personal history of tobacco use, presenting hazards to health 12/31/2017    Sherrilyn Rist, SPT 09/27/18, 4:30 PM  [facility] [address] [place], [address] Phone: [phone]   Fax:  [phone]  Name: DANIK LANYON MRN: [id number] Date of Birth: 16-Sep-1940

## 2018-09-29 ENCOUNTER — Ambulatory Visit: Payer: Medicare Other | Admitting: Physical Therapy

## 2018-09-29 ENCOUNTER — Encounter: Payer: Self-pay | Admitting: Physical Therapy

## 2018-09-29 ENCOUNTER — Other Ambulatory Visit: Payer: Self-pay

## 2018-09-29 DIAGNOSIS — R251 Tremor, unspecified: Secondary | ICD-10-CM

## 2018-09-29 DIAGNOSIS — M5440 Lumbago with sciatica, unspecified side: Secondary | ICD-10-CM

## 2018-09-29 DIAGNOSIS — M25551 Pain in right hip: Secondary | ICD-10-CM

## 2018-09-29 DIAGNOSIS — M25552 Pain in left hip: Secondary | ICD-10-CM

## 2018-09-29 DIAGNOSIS — C4491 Basal cell carcinoma of skin, unspecified: Secondary | ICD-10-CM

## 2018-09-29 DIAGNOSIS — M6281 Muscle weakness (generalized): Secondary | ICD-10-CM

## 2018-09-29 DIAGNOSIS — R296 Repeated falls: Secondary | ICD-10-CM

## 2018-09-29 DIAGNOSIS — G8929 Other chronic pain: Secondary | ICD-10-CM

## 2018-09-29 HISTORY — DX: Basal cell carcinoma of skin, unspecified: C44.91

## 2018-09-29 NOTE — Therapy (Signed)
[facility] [address], [place], [address] Phone: [phone]   Fax:  [phone]  Physical Therapy Treatment  Patient Details  Name: MARQUIL MINE MRN: [id number] Date of Birth: 11-24-1940 Referring Provider (PT): Tracie Harrier, MD   Encounter Date: 09/29/2018  PT End of Session - 09/29/18 1436    Visit Number  6    Number of Visits  24    Date for PT Re-Evaluation  11/29/18    Authorization Type  Medicare reporting period from 09/06/2018    Authorization Time Period  Current Cert period: 0000000 - 11/29/2018 (latest PN: IE 09/06/2018);    Authorization - Visit Number  6    Authorization - Number of Visits  10    PT Start Time  [id number]    PT Stop Time  1430    PT Time Calculation (min)  41 min    Equipment Utilized During Treatment  Gait belt    Activity Tolerance  Patient tolerated treatment well;Patient limited by fatigue    Behavior During Therapy  WFL for tasks assessed/performed       Past Medical History:  Diagnosis Date  . A-fib ([place])   . Anxiety   . BPH (benign prostatic hyperplasia)   . GERD (gastroesophageal reflux disease)   . Hx of basal cell carcinoma 01/22/2015   L crown of scalp  . Hx of squamous cell carcinoma of skin 01/28/2016   L distal pretibial, R medial cheek  . Hyperlipidemia   . Hypertension   . Irritable bowel     Past Surgical History:  Procedure Laterality Date  . BASAL CELL CARCINOMA EXCISION Left 01/22/2015   crown of scalp  . COLONOSCOPY WITH PROPOFOL N/A 01/26/2018   Procedure: COLONOSCOPY WITH PROPOFOL;  Surgeon: Toledo, Benay Pike, MD;  Location: [facility] ENDOSCOPY;  Service: Gastroenterology;  Laterality: N/A;    There were no vitals filed for this visit.  Subjective Assessment - 09/29/18 1354    Subjective  Patient states 0/10 pain in the R posterior hip. No recent falls. Patient reports he had surgery on face today but no symptom of dizziness or  nausea.    Pertinent History  Patient is a 78 y.o. male who presents to outpatient physical therapy with a referral for medical diagnosis weakness of both lower extremities, ambulatory dysfunction, history of recent fall. This patient's chief complaints consist of pain in right hip and leg, LE weakness, difficulty walking, and impaired balance leading to the following functional deficits: difficulty with ADLs, IADLs, housework, cooking, bathing, sleeping, difficulty getting up from chair or floor, difficulty walking, high fall risk, frequent falls with injury. 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 Denies hx of stroke, MI, seizures, DM, or parkinson's.    Limitations  House hold activities;Lifting;Standing;Walking;Other (comment)   difficulty with ADLs, IADLs, housework, cooking, bathing, sleeping, difficulty getting up from chair or floor, difficulty walking, high fall risk, frequent falls with injury   How long can you sit comfortably?  not a major limitation    How long can you stand comfortably?  depends on time of day, in the morning cannot walk  for 40 min after attempting to get up.    How long can you walk comfortably?  depends on time of day, in the morning cannot walk for 40 min after attempting to get up.    Diagnostic tests  MRI R hip from 07/19/2018: "IMPRESSION: 1. Inflammation superficial and deep to the iliotibial band adjacent to the right greater trochanter. No defined fluid in the bursal space. 2. Diffuse degeneration of the superolateral and anterior aspects of the right acetabular labrum consistent with intrasubstance tears. 3. Disc  protrusion at L4-5 on the right noted on CT scan dated 02/01/2018."    Patient Stated Goals  get stronger and get rid of the pain in his hip.    Currently in Pain?  No/denies    Pain Onset  More than a month ago   per documentation      CGA to Min A provided throughout the session.  Therapeutic exercise:to centralize symptoms and improve ROM, strength, muscular endurance, and activity tolerance required for successful completion of functional activities. -NuStep HIIT level3 (32min) 5(70min) 7(99min) 3(81min) withoutusing bilateral upper. Setting11. For improved extremity mobility, muscular endurance, and activity tolerance; and to induce the analgesic effect of aerobic exercise, stimulate improved joint nutrition, and prepare body structures and systems for following interventions.55minutes.56spm, 0.19 mile.Cuing to improve SPM up to 60 and soft end control with stepping.  - Sit <> stand: (to improve functional mobility and BLE strength/edurance to reduce fall risk)  With B UEcross over chest, 1x10 reps  With B UEcross over chest, airex place under R/L foot 2x10 reps. To improve strength and weight bearing on one LE.   CGA to MinA to support from sitting to standing. Multiple failure to stand noted. Curing provided and increase sitting break given to improve LE muscle activation.    - Step up/down stairswith R UE support on railingto improve hip flexor strength and foot clearance to reduce the fall risk and R hip pain. 2x10 reps for each side R/L leading. Cuing for L LE hip flexor activation and foot dorsiflexion. Less foot drug noted and overall improved muscle activation and coordination.  - Ambulation 150 feet with CGA from clinic to patient's car using SPC. Cuing to improve BLE hip flexor activation and BLE dorsiflexion for floor clearance.   Neuromuscular Re-education:to improve, balance, postural strength, muscle activation patterns, and stabilization strength required for  functional activities: - Single leg standing with contralateral LE standing above a football. 2x40s on each LE.  - Tandem standing 40s each side EO   - R foot in front: moderate postural sway and requires less hand assist on bar.   - L foot in front: moderate to severe postural sway and requires frequent hand over bar assistant with LUE - Feet together standing with EO: mild postural sway and require 1-2 hand support over bar.   HOME EXERCISE PROGRAM Access Code: [facility]  URL: [email or web address]  Date: 09/16/2018  Prepared by: Rosita Kea   Exercises Sit to Stand- 2 sets- 5 reps- 2x daily- 7x weekly  Side Stepping with Counter Support- 2 sets- 10 reps-2x daily- 7x weekly  Patient tolerated the session well and presents improved foot clearance with less cuing. Patient demonstrated a great improvement with muscle activation/strength/edurance with step up exercise. Patient also showing improved postural stability with reduced BOS exercise. However, patient still requires extensive cuing and assistant with challenging strengthening tasks assessed today. Will continue progress LE strengthening and narrow BOS balance exercise in later session. Will continue strength, neuro-reeducation,  HEP in upcoming session to improve postural stability, reduce fall risk, and pain control at R hip.       PT Education - 09/29/18 1436    Education Details  Exercise purpose/form. Self management techniques    Person(s) Educated  Patient    Methods  Explanation;Demonstration;Tactile cues;Verbal cues    Comprehension  Returned demonstration;Verbal cues required;Verbalized understanding;Tactile cues required       PT Short Term Goals - 09/27/18 1619      PT SHORT TERM GOAL #1   Title  Be independent with initial home exercise program for self-management of symptoms.    Baseline  to be provided visit 2 (09/06/2018);    Time  2    Period  Weeks    Status  Achieved    Target Date   09/20/18      PT SHORT TERM GOAL #2   Title  Improve 6MWT to equal or greater than 1200 feet to demonstrate improved activity tolerance for community ambulation.    Baseline  1042 feet with no AD and CGA with multiple stumbles and grabbing onto things (09/06/2018);    Time  6    Period  Weeks    Status  New    Target Date  10/18/18      PT SHORT TERM GOAL #3   Title  Patient will improve 5TSTS test to equal or less than 20 seconds to demonstrate improved ability to complete transfers and move around home safely.    Baseline  27.5 with BUE support (09/06/2018);    Time  6    Period  Weeks    Status  New    Target Date  10/18/18      PT SHORT TERM GOAL #4   Title  Patient will improve by at least one point for each of the items on PSFS to show improvement in self-reported function:    Baseline  Walking: 7, moving after sleeping: 0, hip is better: 6 (09/06/2018);    Time  6    Period  Weeks    Status  New    Target Date  10/18/18        PT Long Term Goals - 09/06/18 1636      PT LONG TERM GOAL #1   Title  Be independent with a long-term home exercise program for self-management of symptoms.    Baseline  initial HEP to be provided at visit 2 (09/06/2018);    Time  12    Period  Weeks    Status  New    Target Date  11/29/18      PT LONG TERM GOAL #2   Title  Improve 6MWT to equal or greater than 1400 feet to demonstrate improved activity tolerance for community ambulation.    Baseline  1042 feet with no AD and CGA with multiple stumbles and grabbing onto things (09/06/2018);    Time  12    Period  Weeks    Status  New    Target Date  11/29/18      PT LONG TERM GOAL #3   Title  Patient will improve 5TSTS test to equal or less than 20 seconds with no UE support to demonstrate improved ability to complete transfers and move around home safely.    Baseline  27.5 with BUE support (09/06/2018);    Time  12    Period  Weeks    Status  New    Target Date  10/18/18  PT LONG  TERM GOAL #4   Title  Patient will improve to equal or greater than 8/10 on each of the items on PSFS to show improvement in self-reported function.    Baseline  Walking: 7, moving after sleeping: 0, hip is better: 6 (09/06/2018);    Time  12    Period  Weeks    Status  New    Target Date  11/29/18      PT LONG TERM GOAL #5   Title  Patient will score greater than 19/30 on Functional Gait Assessment to demonstrate moderate or low fall risk.    Baseline  measurement deferred due to fatigue (09/06/2018);    Time  12    Period  Weeks    Status  New    Target Date  11/01/18            Plan - 09/29/18 1437    Clinical Impression Statement  Patient tolerated the session well and presents improved foot clearance with less cuing. Patient demonstrated a great improvement with muscle activation/strength/edurance with step up exercise. Patient also showing improved postural stability with reduced BOS exercise. However, patient still requires extensive cuing and assistant with challenging strengthening tasks assessed today. Will continue progress LE strengthening and narrow BOS balance exercise in later session. Will continue strength, neuro-reeducation, HEP in upcoming session to improve postural stability, reduce fall risk, and pain control at R hip.    Personal Factors and Comorbidities  Age;Time since onset of injury/illness/exacerbation;Behavior Pattern;Comorbidity 3+;Fitness;Past/Current Experience    Comorbidities  a-fib (on warfarin), 6 weeks ago where he thought he was having a heart attack and he went to the heart attack and it was determined to be A-fib, HTN, fatty liver (decreased drinking), hyperlipidemia, neuropathy in both legs (wears compression socks), denies "not really" history of back problems, Hx of skin cancer (removed on face), anxiety. Denies hx of stroke, MI, seizures, DM, or parkinson's.    Examination-Activity Limitations  Bathing;Hygiene/Grooming;Squat;Stairs;Lift;Bed  Mobility;Bend;Locomotion Level;Stand;Caring for [employer];Toileting;Carry;Transfers;Dressing;Sleep;Other   difficulty with ADLs, IADLs, housework, cooking, bathing, sleeping, difficulty getting up from chair or floor, difficulty walking, high fall risk, frequent falls with injury.   Examination-Participation Restrictions  Shop;Cleaning;Community Activity;Meal Prep;Yard Work;Driving;Interpersonal Relationship    Stability/Clinical Decision Making  Evolving/Moderate complexity    Rehab Potential  Good    PT Frequency  2x / week    PT Duration  12 weeks    PT Treatment/Interventions  ADLs/Self Care Home Management;Aquatic Therapy;Canalith Repostioning;Cryotherapy;Moist Heat;Electrical Stimulation;Traction;DME Instruction;Gait training;Stair training;Functional mobility training;Therapeutic activities;Therapeutic exercise;Balance training;Neuromuscular re-education;Patient/family education;Manual techniques;Passive range of motion;Dry needling;Spinal Manipulations;Joint Manipulations;Other (comment)   joint mobilizations grades I-IV   PT Next Visit Plan  establish HEP, complete baseline FGA    PT Home Exercise Plan  to be established at visit 2    Consulted and Agree with Plan of Care  Patient       Patient will benefit from skilled therapeutic intervention in order to improve the following deficits and impairments:  Abnormal gait, Decreased knowledge of use of DME, Impaired sensation, Improper body mechanics, Pain, Decreased coordination, Decreased mobility, Postural dysfunction, Decreased activity tolerance, Decreased endurance, Decreased range of motion, Decreased strength, Hypomobility, Impaired perceived functional ability, Obesity, Impaired flexibility, Increased edema, Difficulty walking, Decreased safety awareness, Decreased balance  Visit Diagnosis: Muscle weakness (generalized)  Chronic bilateral low back pain with sciatica, sciatica laterality unspecified  Repeated  falls  Tremor  Pain in right hip  Pain in left hip     Problem List  Patient Active Problem List   Diagnosis Date Noted  . Hyperlipidemia 04/02/2018  . Essential hypertension 04/02/2018  . Atrial fibrillation ([place]) 04/02/2018  . Pain in limb 04/02/2018  . Personal history of tobacco use, presenting hazards to health 12/31/2017    Sherrilyn Rist, SPT 09/30/18, 10:29 AM  Everlean Alstrom. Graylon Good, PT, DPT 09/30/18, 10:29 AM   [facility] [address], [place], [address] Phone: [phone]   Fax:  [phone]  Name: SAIF DESO MRN: [id number] Date of Birth: Nov 08, 1940

## 2018-10-04 ENCOUNTER — Ambulatory Visit: Payer: Medicare Other | Admitting: Physical Therapy

## 2018-10-04 ENCOUNTER — Encounter: Payer: Self-pay | Admitting: Physical Therapy

## 2018-10-04 ENCOUNTER — Other Ambulatory Visit: Payer: Self-pay

## 2018-10-04 DIAGNOSIS — R251 Tremor, unspecified: Secondary | ICD-10-CM

## 2018-10-04 DIAGNOSIS — M25551 Pain in right hip: Secondary | ICD-10-CM

## 2018-10-04 DIAGNOSIS — M5440 Lumbago with sciatica, unspecified side: Secondary | ICD-10-CM

## 2018-10-04 DIAGNOSIS — M6281 Muscle weakness (generalized): Secondary | ICD-10-CM

## 2018-10-04 DIAGNOSIS — G8929 Other chronic pain: Secondary | ICD-10-CM

## 2018-10-04 DIAGNOSIS — R296 Repeated falls: Secondary | ICD-10-CM

## 2018-10-04 DIAGNOSIS — M25552 Pain in left hip: Secondary | ICD-10-CM

## 2018-10-04 NOTE — Therapy (Signed)
[facility] [address] [place], [address] Phone: [phone]   Fax:  [phone]  Physical Therapy Treatment  Patient Details  Name: ANIRUDH GIVINS MRN: [id number] Date of Birth: May 26, 1940 Referring Provider (PT): Tracie Harrier, MD   Encounter Date: 10/04/2018  PT End of Session - 10/04/18 1853    Visit Number  7    Number of Visits  24    Date for PT Re-Evaluation  11/29/18    Authorization Type  Medicare reporting period from 09/06/2018    Authorization Time Period  Current Cert period: 0000000 - 11/29/2018 (latest PN: IE 09/06/2018);    Authorization - Visit Number  7    Authorization - Number of Visits  10    PT Start Time  [id number]    PT Stop Time  1434    PT Time Calculation (min)  41 min    Equipment Utilized During Treatment  Gait belt    Activity Tolerance  Patient tolerated treatment well;Patient limited by fatigue    Behavior During Therapy  WFL for tasks assessed/performed       Past Medical History:  Diagnosis Date  . A-fib ([place])   . Anxiety   . BPH (benign prostatic hyperplasia)   . GERD (gastroesophageal reflux disease)   . Hx of basal cell carcinoma 01/22/2015   L crown of scalp  . Hx of squamous cell carcinoma of skin 01/28/2016   L distal pretibial, R medial cheek  . Hyperlipidemia   . Hypertension   . Irritable bowel     Past Surgical History:  Procedure Laterality Date  . BASAL CELL CARCINOMA EXCISION Left 01/22/2015   crown of scalp  . COLONOSCOPY WITH PROPOFOL N/A 01/26/2018   Procedure: COLONOSCOPY WITH PROPOFOL;  Surgeon: Toledo, Benay Pike, MD;  Location: [facility] ENDOSCOPY;  Service: Gastroenterology;  Laterality: N/A;    There were no vitals filed for this visit.  Subjective Assessment - 10/04/18 1438    Subjective  Patient showing incrased tremer/fatigue and states 6/10 pain at his R hip and 4/10 at his L hip due to sleep at bed in the last three nights. He  didn't sleep until 3am due to pain at his hips. Patient reports that he has been trying to sleep on his both sides and back on his flat back in the past three days but he was not able to tolerate due to severe pain at his hip. His hips are not hurting before this trial. He also thinking to buy a reclinable bed so that he can sleep comfortably. He has no problem sleeping in his recliner at night.    Pertinent History  Patient is a 78 y.o. male who presents to outpatient physical therapy with a referral for medical diagnosis weakness of both lower extremities, ambulatory dysfunction, history of recent fall. This patient's chief complaints consist of pain in right hip and leg, LE weakness, difficulty walking, and impaired balance leading to the following functional deficits: difficulty with ADLs, IADLs, housework, cooking, bathing, sleeping, difficulty getting up from chair or floor, difficulty walking, high fall risk, frequent falls with injury. Relevant past medical history and comorbidities include a-fib (on warfarin), 6 weeks ago where he thought he was having a heart attack and he went to the heart attack and it was determined to be A-fib, HTN, fatty liver (decreased drinking), hyperlipidemia, neuropathy in both legs (wears compression socks), denies "not really" history of back problems, Hx of skin  cancer (removed on face), anxiety. Denies hx of stroke, MI, seizures, DM, or parkinson's.    Limitations  House hold activities;Lifting;Standing;Walking;Other (comment)   difficulty with ADLs, IADLs, housework, cooking, bathing, sleeping, difficulty getting up from chair or floor, difficulty walking, high fall risk, frequent falls with injury   How long can you sit comfortably?  not a major limitation    How long can you stand comfortably?  depends on time of day, in the morning cannot walk for 40 min after attempting to get up.    How long can you walk comfortably?  depends on time of day, in the morning cannot  walk for 40 min after attempting to get up.    Diagnostic tests  MRI R hip from 07/19/2018: "IMPRESSION: 1. Inflammation superficial and deep to the iliotibial band adjacent to the right greater trochanter. No defined fluid in the bursal space. 2. Diffuse degeneration of the superolateral and anterior aspects of the right acetabular labrum consistent with intrasubstance tears. 3. 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    Currently in Pain?  Yes    Pain Score  6     Pain Location  Hip    Pain Orientation  Right;Left    Pain Onset  More than a month ago   per documentation      CGA to Min A provided throughout the session.  Therapeutic exercise:to centralize symptoms and improve ROM, strength, muscular endurance, and activity tolerance required for successful completion of functional activities. - Sit <> stand: (to improve functional mobility and BLE strength/edurance to reduce fall risk)  With B UEcross over chest, 1x10 reps  With holding pink physioball shoulder flexion at end range at standing. 1x10 reps   With green youth ball between BLE knee 1x10 reps.  CGA to MinA to support from sitting to standing. Reduced cuing for proper forms    - Step up/down stairswithRUE support on railingto improve hip flexor strength and foot clearance to reduce the fall risk and R hip pain. 2x10 reps for each side R/L leading. Less foot drug noted and overall improved muscle activation and coordination.   Therapeutic activities: for functional strengthening and improved functional activity tolerance. - Ambulation 150 feet x2 with CGA without cane clinic <> patient's car using SPC. Cuing to improve BLE hip flexor activation and BLE dorsiflexion for floor clearance at ramp - Ambulation 100 feet x2 with CGA without cane up/down ramp in front of the clinic. Cuing to improve BLE hip flexor  activation and BLE dorsiflexion for floor clearance at ramp - Education provided for safety and recommendation given to ambulation with cane outside of PT session to reduce fall risk.   Neuromuscular Re-education:to improve, balance, postural strength, muscle activation patterns, and stabilization strength required for functional activities: - Stepping x 3 ways (forward/sideway/backward) with single UE support onstationarybar, steping to airex with cuing to improve weight shift. Additional cuing for wider base of support, appropriate step length (patient requires frequent cuing to complete tasks), and forward focusing gaze.1x10 steps each directions. - Standing pink ball toss:   - Normal base: 2x10 resps  - R foot forward: 2x10 reps   - L foot forward: 2x10 reps  Multiple missed catching of the pink ball noted. Trialed with yellow medcine ball but patient untable to catch it when it bounced back. Cuing provided for foot  and trunk postures.   HOME EXERCISE PROGRAM Access Code: [facility]  URL: [email or web address]  Date: 09/16/2018  Prepared by: Rosita Kea   Exercises Sit to Stand- 2 sets- 5 reps- 2x daily- 7x weekly  Side Stepping with Counter Support- 2 sets- 10 reps-2x daily- 7x weekly  Patient tolerated the session well and reduced hip pain (R 1/10 and L 0/10) at the end of the session. Patient showing significant improvements for foot clearance for both stairs and obstacles. Patient also showing improves with LE strengthening for functional mobility such as sit to stand. However, patient still have difficulties with walking up/down ramps for foot clearance as well as moderate postural sway walking without AD. Will continue strength, neuro-reeducation, HEP in upcoming session to improve postural stability, reduce fall risk, and pain control at R hip.    PT Education - 10/04/18 1814    Education Details  Exercise purpose/form. Self management techniques     Person(s) Educated  Patient    Methods  Explanation;Demonstration;Tactile cues;Verbal cues    Comprehension  Verbalized understanding;Returned demonstration;Verbal cues required;Tactile cues required       PT Short Term Goals - 09/27/18 1619      PT SHORT TERM GOAL #1   Title  Be independent with initial home exercise program for self-management of symptoms.    Baseline  to be provided visit 2 (09/06/2018);    Time  2    Period  Weeks    Status  Achieved    Target Date  09/20/18      PT SHORT TERM GOAL #2   Title  Improve 6MWT to equal or greater than 1200 feet to demonstrate improved activity tolerance for community ambulation.    Baseline  1042 feet with no AD and CGA with multiple stumbles and grabbing onto things (09/06/2018);    Time  6    Period  Weeks    Status  New    Target Date  10/18/18      PT SHORT TERM GOAL #3   Title  Patient will improve 5TSTS test to equal or less than 20 seconds to demonstrate improved ability to complete transfers and move around home safely.    Baseline  27.5 with BUE support (09/06/2018);    Time  6    Period  Weeks    Status  New    Target Date  10/18/18      PT SHORT TERM GOAL #4   Title  Patient will improve by at least one point for each of the items on PSFS to show improvement in self-reported function:    Baseline  Walking: 7, moving after sleeping: 0, hip is better: 6 (09/06/2018);    Time  6    Period  Weeks    Status  New    Target Date  10/18/18        PT Long Term Goals - 09/06/18 1636      PT LONG TERM GOAL #1   Title  Be independent with a long-term home exercise program for self-management of symptoms.    Baseline  initial HEP to be provided at visit 2 (09/06/2018);    Time  12    Period  Weeks    Status  New    Target Date  11/29/18      PT LONG TERM GOAL #2   Title  Improve 6MWT to equal or greater than 1400 feet to demonstrate improved activity tolerance for community ambulation.    Baseline  1042 feet with no AD  and CGA with multiple stumbles and grabbing onto things (09/06/2018);    Time  12    Period  Weeks    Status  New    Target Date  11/29/18      PT LONG TERM GOAL #3   Title  Patient will improve 5TSTS test to equal or less than 20 seconds with no UE support to demonstrate improved ability to complete transfers and move around home safely.    Baseline  27.5 with BUE support (09/06/2018);    Time  12    Period  Weeks    Status  New    Target Date  10/18/18      PT LONG TERM GOAL #4   Title  Patient will improve to equal or greater than 8/10 on each of the items on PSFS to show improvement in self-reported function.    Baseline  Walking: 7, moving after sleeping: 0, hip is better: 6 (09/06/2018);    Time  12    Period  Weeks    Status  New    Target Date  11/29/18      PT LONG TERM GOAL #5   Title  Patient will score greater than 19/30 on Functional Gait Assessment to demonstrate moderate or low fall risk.    Baseline  measurement deferred due to fatigue (09/06/2018);    Time  12    Period  Weeks    Status  New    Target Date  11/01/18            Plan - 10/04/18 1815    Clinical Impression Statement  Patient tolerated the session well and reduced hip pain (R 1/10 and L 0/10) at the end of the session. Patient showing significant improvements for foot clearance for both stairs and obstacles. Patient also showing improves with LE strengthening for functional mobility such as sit to stand. However, patient still have difficulties with walking up/down ramps for foot clearance as well as moderate postural sway walking without AD. Will continue strength, neuro-reeducation, HEP in upcoming session to improve postural stability, reduce fall risk, and pain control at R hip.    Personal Factors and Comorbidities  Age;Time since onset of injury/illness/exacerbation;Behavior Pattern;Comorbidity 3+;Fitness;Past/Current Experience    Comorbidities  a-fib (on warfarin), 6 weeks ago where he thought  he was having a heart attack and he went to the heart attack and it was determined to be A-fib, HTN, fatty liver (decreased drinking), hyperlipidemia, neuropathy in both legs (wears compression socks), denies "not really" history of back problems, Hx of skin cancer (removed on face), anxiety. Denies hx of stroke, MI, seizures, DM, or parkinson's.    Examination-Activity Limitations  Bathing;Hygiene/Grooming;Squat;Stairs;Lift;Bed Mobility;Bend;Locomotion Level;Stand;Caring for [employer];Toileting;Carry;Transfers;Dressing;Sleep;Other   difficulty with ADLs, IADLs, housework, cooking, bathing, sleeping, difficulty getting up from chair or floor, difficulty walking, high fall risk, frequent falls with injury.   Examination-Participation Restrictions  Shop;Cleaning;Community Activity;Meal Prep;Yard Work;Driving;Interpersonal Relationship    Stability/Clinical Decision Making  Evolving/Moderate complexity    Rehab Potential  Good    PT Frequency  2x / week    PT Duration  12 weeks    PT Treatment/Interventions  ADLs/Self Care Home Management;Aquatic Therapy;Canalith Repostioning;Cryotherapy;Moist Heat;Electrical Stimulation;Traction;DME Instruction;Gait training;Stair training;Functional mobility training;Therapeutic activities;Therapeutic exercise;Balance training;Neuromuscular re-education;Patient/family education;Manual techniques;Passive range of motion;Dry needling;Spinal Manipulations;Joint Manipulations;Other (comment)   joint mobilizations grades I-IV   PT Next Visit Plan  establish HEP, complete baseline FGA    PT Home Exercise Plan  to be established at visit 2    Consulted and Agree with Plan of Care  Patient       Patient will benefit from skilled therapeutic intervention in order to improve the following deficits and impairments:  Abnormal gait, Decreased knowledge of use of DME, Impaired sensation, Improper body mechanics, Pain, Decreased coordination, Decreased mobility, Postural  dysfunction, Decreased activity tolerance, Decreased endurance, Decreased range of motion, Decreased strength, Hypomobility, Impaired perceived functional ability, Obesity, Impaired flexibility, Increased edema, Difficulty walking, Decreased safety awareness, Decreased balance  Visit Diagnosis: Muscle weakness (generalized)  Chronic bilateral low back pain with sciatica, sciatica laterality unspecified  Repeated falls  Tremor  Pain in right hip  Pain in left hip     Problem List Patient Active Problem List   Diagnosis Date Noted  . Hyperlipidemia 04/02/2018  . Essential hypertension 04/02/2018  . Atrial fibrillation ([place]) 04/02/2018  . Pain in limb 04/02/2018  . Personal history of tobacco use, presenting hazards to health 12/31/2017    Sherrilyn Rist, SPT  Nancy Nordmann, SPT 10/04/2018, 6:55 PM    [facility] [address] [place], [address] Phone: [phone]   Fax:  [phone]  Name: JEROMIAH COPELIN MRN: [id number] Date of Birth: [birth year]

## 2018-10-06 ENCOUNTER — Ambulatory Visit: Payer: Medicare Other | Admitting: Physical Therapy

## 2018-10-06 ENCOUNTER — Encounter: Payer: Self-pay | Admitting: Physical Therapy

## 2018-10-06 ENCOUNTER — Other Ambulatory Visit: Payer: Self-pay

## 2018-10-06 DIAGNOSIS — G8929 Other chronic pain: Secondary | ICD-10-CM

## 2018-10-06 DIAGNOSIS — R251 Tremor, unspecified: Secondary | ICD-10-CM

## 2018-10-06 DIAGNOSIS — M25552 Pain in left hip: Secondary | ICD-10-CM

## 2018-10-06 DIAGNOSIS — R296 Repeated falls: Secondary | ICD-10-CM

## 2018-10-06 DIAGNOSIS — M25551 Pain in right hip: Secondary | ICD-10-CM

## 2018-10-06 DIAGNOSIS — M6281 Muscle weakness (generalized): Secondary | ICD-10-CM

## 2018-10-06 DIAGNOSIS — M5440 Lumbago with sciatica, unspecified side: Secondary | ICD-10-CM

## 2018-10-06 NOTE — Therapy (Signed)
[facility] [address] [place], [address] Phone: [phone]   Fax:  [phone]  Physical Therapy Treatment  Patient Details  Name: Jeremiah Bell MRN: [id number] Date of Birth: Jun 12, 1940 Referring Provider (PT): Tracie Harrier, MD   Encounter Date: 10/06/2018  PT End of Session - 10/06/18 1459    Visit Number  8    Number of Visits  24    Date for PT Re-Evaluation  11/29/18    Authorization Type  Medicare reporting period from 09/06/2018    Authorization Time Period  Current Cert period: 0000000 - 11/29/2018 (latest PN: IE 09/06/2018);    Authorization - Visit Number  8    Authorization - Number of Visits  10    PT Start Time  1350    PT Stop Time  1432    PT Time Calculation (min)  42 min    Equipment Utilized During Treatment  Gait belt    Activity Tolerance  Patient tolerated treatment well;Patient limited by fatigue    Behavior During Therapy  WFL for tasks assessed/performed       Past Medical History:  Diagnosis Date  . A-fib ([place])   . Anxiety   . BPH (benign prostatic hyperplasia)   . GERD (gastroesophageal reflux disease)   . Hx of basal cell carcinoma 01/22/2015   L crown of scalp  . Hx of squamous cell carcinoma of skin 01/28/2016   L distal pretibial, R medial cheek  . Hyperlipidemia   . Hypertension   . Irritable bowel     Past Surgical History:  Procedure Laterality Date  . BASAL CELL CARCINOMA EXCISION Left 01/22/2015   crown of scalp  . COLONOSCOPY WITH PROPOFOL N/A 01/26/2018   Procedure: COLONOSCOPY WITH PROPOFOL;  Surgeon: Toledo, Benay Pike, MD;  Location: [facility];  Service: Gastroenterology;  Laterality: N/A;    There were no vitals filed for this visit.  Subjective Assessment - 10/06/18 1354    Subjective  Patient reports 7/10 pain at his R hip and 1/10 at his L hip. He returned to sleep on his recliner due to difficulties sleeping on the bed. No  recent falls. Patient also showing reduced tremor than last visit.    Pertinent History  Patient is a 78 y.o. male who presents to outpatient physical therapy with a referral for medical diagnosis weakness of both lower extremities, ambulatory dysfunction, history of recent fall. This patient's chief complaints consist of pain in right hip and leg, LE weakness, difficulty walking, and impaired balance leading to the following functional deficits: difficulty with ADLs, IADLs, housework, cooking, bathing, sleeping, difficulty getting up from chair or floor, difficulty walking, high fall risk, frequent falls with injury. 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 Denies hx of stroke, MI, seizures, DM, or parkinson's.    Limitations  House hold activities;Lifting;Standing;Walking;Other (comment)   difficulty with ADLs, IADLs, housework, cooking, bathing, sleeping, difficulty getting up from chair or floor, difficulty walking, high fall risk, frequent falls with injury   How long can you sit comfortably?  not a major limitation    How long can you stand  comfortably?  depends on time of day, in the morning cannot walk for 40 min after attempting to get up.    How long can you walk comfortably?  depends on time of day, in the morning cannot walk for 40 min after attempting to get up.    Diagnostic tests  MRI R hip from 07/19/2018: "IMPRESSION: 1. Inflammation superficial and deep to the iliotibial band adjacent to the right greater trochanter. No defined fluid in the bursal space. 2. Diffuse degeneration of the superolateral and anterior aspects of the right  acetabular labrum consistent with intrasubstance tears. 3. 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    Currently in Pain?  Yes    Pain Score  7     Pain Location  Hip    Pain Orientation  Right;Left    Pain Onset  More than a month ago   per documentation     CGA to Min A provided throughout the session.  Therapeutic exercise:to centralize symptoms and improve ROM, strength, muscular endurance, and activity tolerance required for successful completion of functional activities. -NuStep HIIT level3 (59min) 4(86min) 5(15min)3(2min) withoutusing bilateral upper.Setting11. For improved extremity mobility, muscular endurance, and activity tolerance; and to induce the analgesic effect of aerobic exercise, stimulate improved joint nutrition, and prepare body structures and systems for following interventions.106minutes.67spm, 0.68mile.Cuing to improve SPM up to 70 and soft end control with stepping.   - Sit <> stand: (to improve functional mobility and BLE strength/edurance to reduce fall risk)  With B UEcross over chest, 2x5 reps ? Patient has difficulties once therapist provide cuing "as soon as possible". Patient showing lack of upper trunk forward flexion which led the patient unable to fulling standing and descended heavily.  ? One the second trial, patient able to complete 5 reps but PT provided minA at ascending phase due to patient's lack of forward trunk leaning.  - Ambulation 150 feet with CGA from clinic to patient's car using SPC. Moderate cuing to improve BLE hip flexor activation and BLE dorsiflexion for floor clearance. Patient presents fatigue due to the exercise session today. - Standing upper trunk extension against stationary bar x 20 reps. Patient states no increase pain/discomfort at his hip bilaterally.  - 6 min walk test. 1000 feet completed at 6 mins. SBA to CGA  provided throughout the test with cane. Patient showing mild foot drag at last 200 feet due to lack of muscle endurance, but able to correct without therapist cuing.  - Extended rest break provided at today's session due to patient reports of level of tiredness.   Neuromuscular Re-education:to improve, balance, postural strength, muscle activation patterns, and stabilization strength required for functional activities: - Tandem standing 40s each side EO              - R foot in front: moderate postural sway and requires less hand assist on bar.              - L foot in front: moderate to severe postural sway and requires frequent hand over bar assistant with LUE   HOME EXERCISE PROGRAM Access Code: [facility]  URL: [email or web address]  Date: 09/16/2018  Prepared by: Rosita Kea   Exercises Sit to Stand- 2 sets- 5 reps- 2x daily- 7x weekly  Side Stepping with Counter Support- 2 sets- 10 reps-2x daily- 7x weekly  Patient tolerated the  session well and improved B lE hip pain to (3/10) at the end of the session. Patient able to complete today's challenging exercise with extended rest break offered due to tiredness. Patient also showing improvements with BLE foot clearance. However, patient still requires moderate cueing to improve functional tasks (STS) under the stress of time.  Will continue progress LE strengthening and narrow BOS balance exercise in later session.Will continue strength, neuro-reeducation, HEP in upcoming session to improve postural stability, reduce fall risk, and pain control at R hip.     PT Education - 10/06/18 1401    Education Details  Exercise purpose/form. Self management techniques    Person(s) Educated  Patient    Methods  Explanation;Demonstration;Tactile cues;Verbal cues    Comprehension  Verbalized understanding;Returned demonstration;Verbal cues required;Tactile cues required       PT Short Term Goals - 09/27/18 1619      PT SHORT  TERM GOAL #1   Title  Be independent with initial home exercise program for self-management of symptoms.    Baseline  to be provided visit 2 (09/06/2018);    Time  2    Period  Weeks    Status  Achieved    Target Date  09/20/18      PT SHORT TERM GOAL #2   Title  Improve 6MWT to equal or greater than 1200 feet to demonstrate improved activity tolerance for community ambulation.    Baseline  1042 feet with no AD and CGA with multiple stumbles and grabbing onto things (09/06/2018);    Time  6    Period  Weeks    Status  New    Target Date  10/18/18      PT SHORT TERM GOAL #3   Title  Patient will improve 5TSTS test to equal or less than 20 seconds to demonstrate improved ability to complete transfers and move around home safely.    Baseline  27.5 with BUE support (09/06/2018);    Time  6    Period  Weeks    Status  New    Target Date  10/18/18      PT SHORT TERM GOAL #4   Title  Patient will improve by at least one point for each of the items on PSFS to show improvement in self-reported function:    Baseline  Walking: 7, moving after sleeping: 0, hip is better: 6 (09/06/2018);    Time  6    Period  Weeks    Status  New    Target Date  10/18/18        PT Long Term Goals - 09/06/18 1636      PT LONG TERM GOAL #1   Title  Be independent with a long-term home exercise program for self-management of symptoms.    Baseline  initial HEP to be provided at visit 2 (09/06/2018);    Time  12    Period  Weeks    Status  New    Target Date  11/29/18      PT LONG TERM GOAL #2   Title  Improve 6MWT to equal or greater than 1400 feet to demonstrate improved activity tolerance for community ambulation.    Baseline  1042 feet with no AD and CGA with multiple stumbles and grabbing onto things (09/06/2018);    Time  12    Period  Weeks    Status  New    Target Date  11/29/18      PT LONG TERM GOAL #  3   Title  Patient will improve 5TSTS test to equal or less than 20 seconds with no UE support  to demonstrate improved ability to complete transfers and move around home safely.    Baseline  27.5 with BUE support (09/06/2018);    Time  12    Period  Weeks    Status  New    Target Date  10/18/18      PT LONG TERM GOAL #4   Title  Patient will improve to equal or greater than 8/10 on each of the items on PSFS to show improvement in self-reported function.    Baseline  Walking: 7, moving after sleeping: 0, hip is better: 6 (09/06/2018);    Time  12    Period  Weeks    Status  New    Target Date  11/29/18      PT LONG TERM GOAL #5   Title  Patient will score greater than 19/30 on Functional Gait Assessment to demonstrate moderate or low fall risk.    Baseline  measurement deferred due to fatigue (09/06/2018);    Time  12    Period  Weeks    Status  New    Target Date  11/01/18            Plan - 10/06/18 1459    Clinical Impression Statement  Patient tolerated the session well and improved B lE hip pain to (3/10) at the end of the session. Patient able to complete today's challenging exercise with extended rest break offered due to tiredness. Patient also showing improvements with BLE foot clearance. However, patient still requires moderate cueing to improve functional tasks (STS) under the stress of time.  Will continue progress LE strengthening and narrow BOS balance exercise in later session. Will continue strength, neuro-reeducation, HEP in upcoming session to improve postural stability, reduce fall risk, and pain control at R hip.    Personal Factors and Comorbidities  Age;Time since onset of injury/illness/exacerbation;Behavior Pattern;Comorbidity 3+;Fitness;Past/Current Experience    Comorbidities  a-fib (on warfarin), 6 weeks ago where he thought he was having a heart attack and he went to the heart attack and it was determined to be A-fib, HTN, fatty liver (decreased drinking), hyperlipidemia, neuropathy in both legs (wears compression socks), denies "not really" history of  back problems, Hx of skin cancer (removed on face), anxiety. Denies hx of stroke, MI, seizures, DM, or parkinson's.    Examination-Activity Limitations  Bathing;Hygiene/Grooming;Squat;Stairs;Lift;Bed Mobility;Bend;Locomotion Level;Stand;Caring for [employer];Toileting;Carry;Transfers;Dressing;Sleep;Other   difficulty with ADLs, IADLs, housework, cooking, bathing, sleeping, difficulty getting up from chair or floor, difficulty walking, high fall risk, frequent falls with injury.   Examination-Participation Restrictions  Shop;Cleaning;Community Activity;Meal Prep;Yard Work;Driving;Interpersonal Relationship    Stability/Clinical Decision Making  Evolving/Moderate complexity    Rehab Potential  Good    PT Frequency  2x / week    PT Duration  12 weeks    PT Treatment/Interventions  ADLs/Self Care Home Management;Aquatic Therapy;Canalith Repostioning;Cryotherapy;Moist Heat;Electrical Stimulation;Traction;DME Instruction;Gait training;Stair training;Functional mobility training;Therapeutic activities;Therapeutic exercise;Balance training;Neuromuscular re-education;Patient/family education;Manual techniques;Passive range of motion;Dry needling;Spinal Manipulations;Joint Manipulations;Other (comment)   joint mobilizations grades I-IV   PT Next Visit Plan  establish HEP, complete baseline FGA    PT Home Exercise Plan  to be established at visit 2    Consulted and Agree with Plan of Care  Patient       Patient will benefit from skilled therapeutic intervention in order to improve the following deficits and impairments:  Abnormal gait, Decreased knowledge  of use of DME, Impaired sensation, Improper body mechanics, Pain, Decreased coordination, Decreased mobility, Postural dysfunction, Decreased activity tolerance, Decreased endurance, Decreased range of motion, Decreased strength, Hypomobility, Impaired perceived functional ability, Obesity, Impaired flexibility, Increased edema, Difficulty walking,  Decreased safety awareness, Decreased balance  Visit Diagnosis: Muscle weakness (generalized)  Chronic bilateral low back pain with sciatica, sciatica laterality unspecified  Repeated falls  Tremor  Pain in right hip  Pain in left hip     Problem List Patient Active Problem List   Diagnosis Date Noted  . Hyperlipidemia 04/02/2018  . Essential hypertension 04/02/2018  . Atrial fibrillation ([place]) 04/02/2018  . Pain in limb 04/02/2018  . Personal history of tobacco use, presenting hazards to health 12/31/2017   Sherrilyn Rist, SPT 10/06/18, 3:25 PM  Everlean Alstrom. Graylon Good, PT, DPT 10/06/18, 3:28 PM    [facility] [address], [place], [address] Phone: [phone]   Fax:  [phone]  Name: VENCE BAHN MRN: [id number] Date of Birth: 1940/01/17

## 2018-10-11 ENCOUNTER — Other Ambulatory Visit: Payer: Self-pay

## 2018-10-11 ENCOUNTER — Encounter: Payer: Self-pay | Admitting: Physical Therapy

## 2018-10-11 ENCOUNTER — Ambulatory Visit: Payer: Medicare Other | Admitting: Physical Therapy

## 2018-10-11 DIAGNOSIS — M5440 Lumbago with sciatica, unspecified side: Secondary | ICD-10-CM

## 2018-10-11 DIAGNOSIS — M25551 Pain in right hip: Secondary | ICD-10-CM

## 2018-10-11 DIAGNOSIS — G8929 Other chronic pain: Secondary | ICD-10-CM

## 2018-10-11 DIAGNOSIS — R251 Tremor, unspecified: Secondary | ICD-10-CM

## 2018-10-11 DIAGNOSIS — M6281 Muscle weakness (generalized): Secondary | ICD-10-CM

## 2018-10-11 DIAGNOSIS — R296 Repeated falls: Secondary | ICD-10-CM

## 2018-10-11 DIAGNOSIS — M25552 Pain in left hip: Secondary | ICD-10-CM

## 2018-10-11 NOTE — Therapy (Signed)
[facility] [address] [place], [address] Phone: [phone]   Fax:  [phone]  Physical Therapy Treatment  Patient Details  Name: Jeremiah Bell MRN: [id number] Date of Birth: 1940-02-14 Referring Provider (PT): Tracie Harrier, MD   Encounter Date: 10/11/2018  PT End of Session - 10/11/18 1404    Visit Number  9    Number of Visits  24    Date for PT Re-Evaluation  11/29/18    Authorization Type  Medicare reporting period from 09/06/2018    Authorization Time Period  Current Cert period: 0000000 - 11/29/2018 (latest PN: IE 09/06/2018);    Authorization - Visit Number  9    Authorization - Number of Visits  10    PT Start Time  1351    PT Stop Time  1432    PT Time Calculation (min)  41 min    Equipment Utilized During Treatment  Gait belt    Activity Tolerance  Patient tolerated treatment well;Patient limited by fatigue    Behavior During Therapy  WFL for tasks assessed/performed       Past Medical History:  Diagnosis Date  . A-fib ([place])   . Anxiety   . BPH (benign prostatic hyperplasia)   . GERD (gastroesophageal reflux disease)   . Hx of basal cell carcinoma 01/22/2015   L crown of scalp  . Hx of squamous cell carcinoma of skin 01/28/2016   L distal pretibial, R medial cheek  . Hyperlipidemia   . Hypertension   . Irritable bowel     Past Surgical History:  Procedure Laterality Date  . BASAL CELL CARCINOMA EXCISION Left 01/22/2015   crown of scalp  . COLONOSCOPY WITH PROPOFOL N/A 01/26/2018   Procedure: COLONOSCOPY WITH PROPOFOL;  Surgeon: Toledo, Benay Pike, MD;  Location: [facility] ENDOSCOPY;  Service: Gastroenterology;  Laterality: N/A;    There were no vitals filed for this visit.  Subjective Assessment - 10/11/18 1357    Subjective  Patient reports 6/10 pain at his R hip and 1/10 at his L hip. He fell into his chair when standing up today twice this morining because he felt  his legs are really weak. He slept fine recently. He has not been able to do his HEP recently due to his pain and tiredness    Pertinent History  Patient is a 78 y.o. male who presents to outpatient physical therapy with a referral for medical diagnosis weakness of both lower extremities, ambulatory dysfunction, history of recent fall. This patient's chief complaints consist of pain in right hip and leg, LE weakness, difficulty walking, and impaired balance leading to the following functional deficits: difficulty with ADLs, IADLs, housework, cooking, bathing, sleeping, difficulty getting up from chair or floor, difficulty walking, high fall risk, frequent falls with injury. 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 Denies hx of stroke, MI, seizures, DM, or parkinson's.    Limitations  House hold activities;Lifting;Standing;Walking;Other (comment)   difficulty with ADLs, IADLs, housework, cooking, bathing, sleeping, difficulty getting up from chair or floor, difficulty walking, high fall risk, frequent falls with injury   How long can you  sit comfortably?  not a major limitation    How long can you stand comfortably?  depends on time of day, in the morning cannot walk for 40 min after attempting to get up.    How long can you walk comfortably?  depends on time of day, in the morning cannot walk for 40 min after attempting to get up.    Diagnostic tests  MRI R hip from 07/19/2018: "IMPRESSION: 1. Inflammation superficial and deep to the iliotibial band adjacent to the right greater trochanter. No defined fluid in the bursal space. 2. Diffuse degeneration of the  superolateral and anterior aspects of the right acetabular labrum consistent with intrasubstance tears. 3. 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    Currently in Pain?  Yes    Pain Score  6     Pain Location  Hip    Pain Orientation  Left;Right    Pain Onset  More than a month ago   per documentation     CGA to Min Aprovided throughout the session.  Therapeutic exercise:to centralize symptoms and improve ROM, strength, muscular endurance, and activity tolerance required for successful completion of functional activities. -NuStep HIIT level3 (46min) 5(28min) withoutusing bilateral upper.Setting11. For improved extremity mobility, muscular endurance, and activity tolerance; and to induce the analgesic effect of aerobic exercise, stimulate improved joint nutrition, and prepare body structures and systems for following interventions.35minutes.56spm, 0.19mile.Cuing to improve SPM up to 60 and soft end control with stepping.  - Sit <> stand: (to improve functional mobility and BLE strength/edurance to reduce fall risk)  With B UEcross over chest,1x10 reps  With holding pink physioball shoulder flexion at end range at standing. 1x10 reps   With B UEcross over chest,1x10 reps with 2 times rocking back and forth prior to STS to facilitate stand up motion.  With B UEcross over chest,1x10 reps with 3 times rocking back and forth motion prior to STS to facilitate stand up motion.  With B UEcross over chest,1x8 reps. Patient unable to proceed further reps due to pain at his R hip as well as general weakness.  CGA to MinA to support from sitting to standing. Extensive cuing provided for noes over toes to improve biomechanic advantages.     - Standing at inside of treadmill bar to perform lumbar extension against bar and BUE stabilized at the bar.   - Step up/down stairs(3  reps x 4steps) withRUE support on railingto improve hip flexor strength and foot clearance to reduce the fall risk and R hip pain. Patient presents difficulties with stepping down stairs due to struggles to clear the step fully at heels for bilateral LEs.  - Extensive breaks offered throughout the session due to fatigue and weakness at his whole body, pain at his R hip per patient reports.  - Patient was educated on his current condition as well prognosis of PT treatment.   Therapeutic activities: for functional strengthening and improved functional activity tolerance. - Ambulation 150 feet x1 with CGA to patient's car using SPC. Cuing to improve foot clearance to reduce fall risk.      HOME EXERCISE PROGRAM Access Code: [facility]  URL: [email or web address]  Date: 09/16/2018  Prepared by: Rosita Kea   Exercises Sit to Stand- 2 sets- 5 reps- 2x daily- 7x weekly  Side Stepping with Counter Support- 2 sets- 10 reps-2x daily- 7x  weekly  Patient tolerated the session fairand reduced hip pain (R 3/10 and L 0/10) at the end of the session. Patient limited participation to today's activities due to fatigue, lack of eating and weakness. Focused a lot on sit <> stand mechanics due to recent falls into chair. Patient requires extensive cuing to perform sit to stand exercises due to lack of forward folding and pain at his R hip. However, patient is still demonstrated good reduction of his R hip pain as well as improved biomechanics from sitting to standing. Will continue strength, neuro-reeducation, HEP in upcoming session to improve postural stability, reduce fall risk, and pain control at R hip.    PT Education - 10/11/18 1403    Education Details  Exercise purpose/form. Self management techniques    Person(s) Educated  Patient    Methods  Explanation;Demonstration;Tactile cues;Verbal cues    Comprehension  Verbalized understanding;Returned demonstration;Verbal cues  required;Tactile cues required       PT Short Term Goals - 09/27/18 1619      PT SHORT TERM GOAL #1   Title  Be independent with initial home exercise program for self-management of symptoms.    Baseline  to be provided visit 2 (09/06/2018);    Time  2    Period  Weeks    Status  Achieved    Target Date  09/20/18      PT SHORT TERM GOAL #2   Title  Improve 6MWT to equal or greater than 1200 feet to demonstrate improved activity tolerance for community ambulation.    Baseline  1042 feet with no AD and CGA with multiple stumbles and grabbing onto things (09/06/2018);    Time  6    Period  Weeks    Status  New    Target Date  10/18/18      PT SHORT TERM GOAL #3   Title  Patient will improve 5TSTS test to equal or less than 20 seconds to demonstrate improved ability to complete transfers and move around home safely.    Baseline  27.5 with BUE support (09/06/2018);    Time  6    Period  Weeks    Status  New    Target Date  10/18/18      PT SHORT TERM GOAL #4   Title  Patient will improve by at least one point for each of the items on PSFS to show improvement in self-reported function:    Baseline  Walking: 7, moving after sleeping: 0, hip is better: 6 (09/06/2018);    Time  6    Period  Weeks    Status  New    Target Date  10/18/18        PT Long Term Goals - 09/06/18 1636      PT LONG TERM GOAL #1   Title  Be independent with a long-term home exercise program for self-management of symptoms.    Baseline  initial HEP to be provided at visit 2 (09/06/2018);    Time  12    Period  Weeks    Status  New    Target Date  11/29/18      PT LONG TERM GOAL #2   Title  Improve 6MWT to equal or greater than 1400 feet to demonstrate improved activity tolerance for community ambulation.    Baseline  1042 feet with no AD and CGA with multiple stumbles and grabbing onto things (09/06/2018);    Time  12    Period  Weeks    Status  New    Target Date  11/29/18      PT LONG TERM GOAL #3    Title  Patient will improve 5TSTS test to equal or less than 20 seconds with no UE support to demonstrate improved ability to complete transfers and move around home safely.    Baseline  27.5 with BUE support (09/06/2018);    Time  12    Period  Weeks    Status  New    Target Date  10/18/18      PT LONG TERM GOAL #4   Title  Patient will improve to equal or greater than 8/10 on each of the items on PSFS to show improvement in self-reported function.    Baseline  Walking: 7, moving after sleeping: 0, hip is better: 6 (09/06/2018);    Time  12    Period  Weeks    Status  New    Target Date  11/29/18      PT LONG TERM GOAL #5   Title  Patient will score greater than 19/30 on Functional Gait Assessment to demonstrate moderate or low fall risk.    Baseline  measurement deferred due to fatigue (09/06/2018);    Time  12    Period  Weeks    Status  New    Target Date  11/01/18            Plan - 10/11/18 1752    Clinical Impression Statement  Patient tolerated the session fair and reduced hip pain (R 3/10 and L 0/10) at the end of the session. Patient limited participation to today's activities due to fatigue, lack of eating and weakness. Focused a lot on sit <> stand mechanics due to recent falls into chair. Patient requires extensive cuing to perform sit to stand exercises due to lack of forward folding and pain at his R hip. However, patient is still demonstrated good reduction of his R hip pain as well as improved biomechanics from sitting to standing. Will continue strength, neuro-reeducation, HEP in upcoming session to improve postural stability, reduce fall risk, and pain control at R hip.    Personal Factors and Comorbidities  Age;Time since onset of injury/illness/exacerbation;Behavior Pattern;Comorbidity 3+;Fitness;Past/Current Experience    Comorbidities  a-fib (on warfarin), 6 weeks ago where he thought he was having a heart attack and he went to the heart attack and it was  determined to be A-fib, HTN, fatty liver (decreased drinking), hyperlipidemia, neuropathy in both legs (wears compression socks), denies "not really" history of back problems, Hx of skin cancer (removed on face), anxiety. Denies hx of stroke, MI, seizures, DM, or parkinson's.    Examination-Activity Limitations  Bathing;Hygiene/Grooming;Squat;Stairs;Lift;Bed Mobility;Bend;Locomotion Level;Stand;Caring for [employer];Toileting;Carry;Transfers;Dressing;Sleep;Other   difficulty with ADLs, IADLs, housework, cooking, bathing, sleeping, difficulty getting up from chair or floor, difficulty walking, high fall risk, frequent falls with injury.   Examination-Participation Restrictions  Shop;Cleaning;Community Activity;Meal Prep;Yard Work;Driving;Interpersonal Relationship    Stability/Clinical Decision Making  Evolving/Moderate complexity    Rehab Potential  Good    PT Frequency  2x / week    PT Duration  12 weeks    PT Treatment/Interventions  ADLs/Self Care Home Management;Aquatic Therapy;Canalith Repostioning;Cryotherapy;Moist Heat;Electrical Stimulation;Traction;DME Instruction;Gait training;Stair training;Functional mobility training;Therapeutic activities;Therapeutic exercise;Balance training;Neuromuscular re-education;Patient/family education;Manual techniques;Passive range of motion;Dry needling;Spinal Manipulations;Joint Manipulations;Other (comment)   joint mobilizations grades I-IV   PT Next Visit Plan  establish HEP, complete baseline FGA    PT Home Exercise Plan  to be established  at visit 2    Consulted and Agree with Plan of Care  Patient       Patient will benefit from skilled therapeutic intervention in order to improve the following deficits and impairments:  Abnormal gait, Decreased knowledge of use of DME, Impaired sensation, Improper body mechanics, Pain, Decreased coordination, Decreased mobility, Postural dysfunction, Decreased activity tolerance, Decreased endurance,  Decreased range of motion, Decreased strength, Hypomobility, Impaired perceived functional ability, Obesity, Impaired flexibility, Increased edema, Difficulty walking, Decreased safety awareness, Decreased balance  Visit Diagnosis: Muscle weakness (generalized)  Chronic bilateral low back pain with sciatica, sciatica laterality unspecified  Repeated falls  Tremor  Pain in right hip  Pain in left hip     Problem List Patient Active Problem List   Diagnosis Date Noted  . Hyperlipidemia 04/02/2018  . Essential hypertension 04/02/2018  . Atrial fibrillation ([place]) 04/02/2018  . Pain in limb 04/02/2018  . Personal history of tobacco use, presenting hazards to health 12/31/2017    Sherrilyn Rist, SPT 10/12/18, 9:35 AM  Everlean Alstrom. Graylon Good, PT, DPT 10/12/18, 9:36 AM  [facility] [address], [place], [address] Phone: [phone]   Fax:  [phone]  Name: DAREON SORDEN MRN: [id number] Date of Birth: 09/05/1940

## 2018-10-13 ENCOUNTER — Ambulatory Visit: Payer: Medicare Other | Admitting: Physical Therapy

## 2018-10-13 ENCOUNTER — Encounter: Payer: Self-pay | Admitting: Physical Therapy

## 2018-10-13 ENCOUNTER — Other Ambulatory Visit: Payer: Self-pay

## 2018-10-13 DIAGNOSIS — R296 Repeated falls: Secondary | ICD-10-CM

## 2018-10-13 DIAGNOSIS — M25552 Pain in left hip: Secondary | ICD-10-CM

## 2018-10-13 DIAGNOSIS — M6281 Muscle weakness (generalized): Secondary | ICD-10-CM | POA: Diagnosis not present

## 2018-10-13 DIAGNOSIS — M25551 Pain in right hip: Secondary | ICD-10-CM

## 2018-10-13 DIAGNOSIS — R251 Tremor, unspecified: Secondary | ICD-10-CM

## 2018-10-13 DIAGNOSIS — G8929 Other chronic pain: Secondary | ICD-10-CM

## 2018-10-13 NOTE — Therapy (Signed)
[facility] [address] [place], [address] Phone: [phone]   Fax:  [phone]  Physical Therapy Treatment/ Physical Therapy Progress Note   Dates of reporting period 09/06/2018 to 10/13/2018  Patient Details  Name: Jeremiah Bell MRN: [id number] Date of Birth: 02-Jun-1940 Referring Provider (PT): Tracie Harrier, MD   Encounter Date: 10/13/2018  PT End of Session - 10/13/18 1410    Visit Number  10    Number of Visits  24    Date for PT Re-Evaluation  11/29/18    Authorization Type  Medicare reporting period from 09/06/2018    Authorization Time Period  Current Cert period: 03/13/6008 - 11/29/2018 (latest PN: IE 09/06/2018);    Authorization - Visit Number  10    Authorization - Number of Visits  10    PT Start Time  1351    PT Stop Time  1432    PT Time Calculation (min)  41 min    Equipment Utilized During Treatment  Gait belt    Activity Tolerance  Patient tolerated treatment well;Patient limited by fatigue    Behavior During Therapy  WFL for tasks assessed/performed       Past Medical History:  Diagnosis Date  . A-fib ([place])   . Anxiety   . BPH (benign prostatic hyperplasia)   . GERD (gastroesophageal reflux disease)   . Hx of basal cell carcinoma 01/22/2015   L crown of scalp  . Hx of squamous cell carcinoma of skin 01/28/2016   L distal pretibial, R medial cheek  . Hyperlipidemia   . Hypertension   . Irritable bowel     Past Surgical History:  Procedure Laterality Date  . BASAL CELL CARCINOMA EXCISION Left 01/22/2015   crown of scalp  . COLONOSCOPY WITH PROPOFOL N/A 01/26/2018   Procedure: COLONOSCOPY WITH PROPOFOL;  Surgeon: Toledo, Benay Pike, MD;  Location: [facility] ENDOSCOPY;  Service: Gastroenterology;  Laterality: N/A;    There were no vitals filed for this visit.  Subjective Assessment - 10/13/18 1937    Subjective  Patient reports 4/10 pain at his R hip and 1/10 at his L  hip. Patient has no recent falls since the last session. Patient also states he has difficulites sleeping last night so he felt pretty weak today. Patient states he would like to complete his next two physical therapy sessions that were previously planned and then discharge because he has a lot of other doctor's appointments and health issues to take care of and does not have time to continue attending.    Pertinent History  Patient is a 78 y.o. male who presents to outpatient physical therapy with a referral for medical diagnosis weakness of both lower extremities, ambulatory dysfunction, history of recent fall. This patient's chief complaints consist of pain in right hip and leg, LE weakness, difficulty walking, and impaired balance leading to the following functional deficits: difficulty with ADLs, IADLs, housework, cooking, bathing, sleeping, difficulty getting up from chair or floor, difficulty walking, high fall risk, frequent falls with injury. 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 Denies hx of stroke, MI, seizures, DM, or parkinson's.  Limitations  House hold activities;Lifting;Standing;Walking;Other (comment)   difficulty with ADLs, IADLs, housework, cooking, bathing, sleeping, difficulty getting up from chair or floor, difficulty walking, high fall risk, frequent falls with injury   How long can you sit comfortably?  not a major limitation    How long can you stand comfortably?  depends on time of day, in the morning cannot walk for 40 min after attempting to get up.    How long can you walk comfortably?  depends on time of day, in the morning  cannot walk for 40 min after attempting to get up.    Diagnostic tests  MRI R hip from 07/19/2018: "IMPRESSION: 1. Inflammation superficial and deep to the iliotibial band adjacent to the right greater trochanter. No defined fluid in the bursal space. 2. Diffuse degeneration of the superolateral and anterior aspects of the right acetabular labrum consistent with intrasubstance tears. 3. 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    Currently in Pain?  Yes    Pain Score  4     Pain Location  Hip    Pain Orientation  Right;Left    Pain Onset  More than a month ago   per documentation         [facility] PT Assessment - 10/13/18 0001      Functional Gait  Assessment   Gait assessed   Yes    Gait Level Surface  Walks 20 ft in less than 7 sec but greater than 5.5 sec, uses assistive device, slower speed, mild gait deviations, or deviates 6-10 in outside of the 12 in walkway width.   with cane   Change in Gait Speed  Able to change speed, demonstrates mild gait deviations, deviates 6-10 in outside of the 12 in walkway width, or no gait deviations, unable to achieve a major change in velocity, or uses a change in velocity, or uses an assistive device.   with cane   Gait with Horizontal Head Turns  Performs head turns smoothly with slight change in gait velocity (eg, minor disruption to smooth gait path), deviates 6-10 in outside 12 in walkway width, or uses an assistive device.   with cane   Gait with Vertical Head Turns  Performs task with slight change in gait velocity (eg, minor disruption to smooth gait path), deviates 6 - 10 in outside 12 in walkway width or uses assistive device   with cane    Gait and Pivot Turn  Pivot turns safely in greater than 3 sec and stops with no loss of balance, or pivot turns safely within 3 sec and stops with mild imbalance, requires small steps to catch balance.    with cane   Step Over Obstacle  Is able to step over one shoe box (4.5 in total height) without changing gait speed. No evidence of imbalance.   with cane    Gait with Narrow Base of Support  Ambulates 7-9 steps.   At treadmill bars and one UE support intermittenly   Gait with Eyes Closed  Walks 20 ft, uses assistive device, slower speed, mild gait deviations, deviates 6-10 in outside 12 in walkway width. Ambulates 20 ft in less than 9 sec but greater than 7 sec.   with cane   Ambulating Backwards  Walks 20 ft, uses assistive device, slower speed, mild gait deviations, deviates 6-10 in  outside 12 in walkway width.   with cane   Steps  Alternating feet, must use rail.   with cane   Total Score  20      OBJECTIVE: PATIENT REPORTED OUTCOMES:  Patient Specific Functional Scale:  1. Walking: 8/10 2. Moving around following sleep: 8/10 3. Hip is better: 7/10  FUNCTIONAL MOBILITY:  Bed mobility: deferred.   Transfers: sit <> stand transfer without UE support. Have difficulties to stand up when under pressure when knowing he is under tested or timed.   Gait: Ambulated with bilateral foot drag and with cane after 5 minutes walking. CGA provided to prevent fall.   Stairs: BUE support at railings with alternating steps.   FUNCTIONAL/BALANCE TESTS:  Six Minute Walk Test: 950 feet with cane and CGA. No sign of loss balance during the test. Patient presented increased fatigue at the end by increased foot dragging bilaterally. More on the R side foot   5TSTS: 15s without BUE support, CGA from 19.5 inch plinth, when not cued that he is being timed. Unable to complete when he was told he was being timed. Becomes flustered and demo poor mechanics resulting in unable to rise when he is feeling time pressure.     TREATMENT:   Therapeutic exercise:to centralize symptoms and improve ROM, strength, muscular endurance, and activity tolerance required for successful completion of functional  activities.  - sit <> stand 5x10 from low plinth with SBA - min A with cuing for improved mechanics for improved mobility and safety. (10reps with orange medicine ball to improve LE strengthening and endurance) - Ambulation 6 minutes with cane and CGA to improve muscle endurance and activity tolerance. Patient states increased R hip pain at the last 1 minute walk but able to complete the task without falling.  - Stair ambulation x 4 steps with BUE support on stairs and alternating steps.  - functional balance assessment to assess progress.  - Exercise purpose/form. Self management techniques. Education on diagnosis, prognosis, POC, anatomy and physiology of current condition.   HOME EXERCISE PROGRAM  Access Code: [email or web address]  URL: [email or web address]  Date: 10/04/2018  Prepared by: Rosita Kea    Exercises  Sit to Stand without Arm Support - 3 sets - 10 reps - 1x daily - 7x weekly  Standing Hip Abduction with Counter Support - 10 reps - 3 sets - 3 hold - 1x daily - 7x weekly  Standing Hip Extension with Counter Support - 10 reps - 3 sets - 3 hold - 1x daily - 7x weekly  Heel rises with counter support - 2 sets - 10 reps - 1x daily - 7x weekly  Toe Raises with Counter Support - 2 sets - 10 reps - 1x daily - 7x weekly    Clinical impression:  Patient has attended 10 skilled physical therapy treatment sessions this episode of care and overall presents with good progress towards stated goals. Subjectively, patient reports feeling increased of pain at his bilateral hips and has not been able to complete HEP consistently at home due to lack of sleeping, weakness, falls to his chairs and lack of good eating habits. Objectively, patient demonstrates good progression towards his patient specific functional goals. He also had good gain in safety awareness, body mechanics during walking test (6MWT), lower extremities endurance, functional mobility, and LE strength and power (5TSTS). Patient  continues do demonstrate impaired balance, difficulty with motor control including timing and weight shifts for transfers, decreased endurance, and pain that negatively impactshis safety and  ability to complete transfers, household and community mobility, ADLs, IADLs, and usual activities and decreases his quality of life. This physical therapist  provided education on patient current health condition and gains throughout this episode of care and strongly recommended continue physical therapy care. However, patient states he would like to discharge from physical therapy session after two more visits due to his current low level of energy and his current excessive medical appointments which he is not able to handle. Physical therapy care in the future sessions will focus on helping patient develop a long term exercise plan to maintain his gains in this episode of care and continue to improve LE strengthening, balance, muscle endurance and pain control. Patient will benefit from continued skilled physical therapy intervention to address current body structure impairments and activity limitations to improve function and work towards goals set in current POC in order to return to prior level of function or maximal functional improvement.      PT Education - 10/13/18 1941    Education Details  Exercise purpose/form. Self management techniques    Person(s) Educated  Patient    Methods  Explanation;Demonstration;Tactile cues;Verbal cues    Comprehension  Verbalized understanding;Returned demonstration;Verbal cues required;Tactile cues required       PT Short Term Goals - 10/13/18 2032      PT SHORT TERM GOAL #1   Title  Be independent with initial home exercise program for self-management of symptoms.    Baseline  initial HEP to be provided at visit 2 (09/06/2018); cannot perfrom constantly at home  (10/13/2018)    Time  2    Period  Weeks    Status  On-going    Target Date  09/20/18      PT SHORT  TERM GOAL #2   Title  Improve 6MWT to equal or greater than 1200 feet to demonstrate improved activity tolerance for community ambulation.    Baseline  1042 feet with no AD and CGA with multiple stumbles and grabbing onto things (09/06/2018); 950 feet with cane and CGA. improved foot dragging and postral sway (10/13/2018)    Time  6    Period  Weeks    Status  On-going    Target Date  10/18/18      PT SHORT TERM GOAL #3   Title  Patient will improve 5TSTS test to equal or less than 20 seconds to demonstrate improved ability to complete transfers and move around home safely.    Baseline  27.5 with BUE support (09/06/2018); 15s but unable to attempt when PT tells the patient to perform under timed or pressured  (10/13/2018)    Time  6    Period  Weeks    Status  On-going    Target Date  10/18/18      PT SHORT TERM GOAL #4   Title  Patient will improve by at least one point for each of the items on PSFS to show improvement in self-reported function:    Baseline  Walking: 7, moving after sleeping: 0, hip is better: 6 (09/06/2018); Walking: 8/10; Moving around following sleep: 8/10; Hip is better: 7/10    Time  6    Period  Weeks    Status  On-going    Target Date  10/18/18        PT Long Term Goals - 10/13/18 2024      PT LONG TERM GOAL #1   Title  Be independent with a long-term home exercise program for self-management  of symptoms.    Baseline  initial HEP to be provided at visit 2 (09/06/2018); cannot perfrom constantly at home  (10/13/2018)    Time  12    Period  Weeks    Status  On-going    Target Date  11/29/18      PT LONG TERM GOAL #2   Title  Improve 6MWT to equal or greater than 1400 feet to demonstrate improved activity tolerance for community ambulation.    Baseline  1042 feet with no AD and CGA with multiple stumbles and grabbing onto things (09/06/2018); 950 feet with cane and CGA. improved foot dragging and postral sway (10/13/2018)    Time  12    Period  Weeks     Status  On-going    Target Date  11/29/18      PT LONG TERM GOAL #3   Title  Patient will improve 5TSTS test to equal or less than 20 seconds with no UE support to demonstrate improved ability to complete transfers and move around home safely.    Baseline  27.5 with BUE support (09/06/2018); 15s but unable to attempt when PT tells the patient to perform under timed or pressured  (10/13/2018)    Time  12    Period  Weeks    Status  On-going    Target Date  11/29/18      PT LONG TERM GOAL #4   Title  Patient will improve to equal or greater than 8/10 on each of the items on PSFS to show improvement in self-reported function.    Baseline  Walking: 7, moving after sleeping: 0, hip is better: 6 (09/06/2018); Walking: 8/10; Moving around following sleep: 8/10; Hip is better: 7/10(10/13/2018)    Time  12    Period  Weeks    Status  On-going    Target Date  11/29/18      PT LONG TERM GOAL #5   Title  Patient will score greater than 19/30 on Functional Gait Assessment to demonstrate moderate or low fall risk.    Baseline  measurement deferred due to fatigue (09/06/2018); 20/30 with CGA and cane (10/13/2018)    Time  12    Period  Weeks    Status  Partially Met    Target Date  11/29/18            Plan - 10/13/18 2014    Clinical Impression Statement  Patient has attended 10 skilled physical therapy treatment sessions this episode of care and overall presents with good progress towards stated goals. Subjectively, patient reports feeling increased of pain at his bilateral hips and has not been able to complete HEP consistently at home due to lack of sleeping, weakness, falls to his chairs and lack of good eating habits. Objectively, patient demonstrates good progression towards his patient specific functional goals. He also had good gain in safety awareness, body mechanics during walking test (6MWT), lower extremities endurance, functional mobility, and LE strength and power (5TSTS). Patient  continues do demonstrate impaired balance, difficulty with motor control including timing and weight shifts for transfers, decreased endurance, and pain that negatively impactshis safety and ability to complete transfers, household and community mobility, ADLs, IADLs, and usual activities and decreases his quality of life. This physical therapist  provided education on patient current health condition and gains throughout this episode of care and strongly recommended continue physical therapy care. However, patient states he would like to discharge from physical therapy session after two more visits  due to his current low level of energy and his current excessive medical appointments which he is not able to handle. Physical therapy care in the future sessions will focus on helping patient develop a long term exercise plan to maintain his gains in this episode of care and continue to improve LE strengthening, balance, muscle endurance and pain control. Patient will benefit from continued skilled physical therapy intervention to address current body structure impairments and activity limitations to improve function and work towards goals set in current POC in order to return to prior level of function or maximal functional improvement.    Personal Factors and Comorbidities  Age;Time since onset of injury/illness/exacerbation;Behavior Pattern;Comorbidity 3+;Fitness;Past/Current Experience    Comorbidities  a-fib (on warfarin), 6 weeks ago where he thought he was having a heart attack and he went to the heart attack and it was determined to be A-fib, HTN, fatty liver (decreased drinking), hyperlipidemia, neuropathy in both legs (wears compression socks), denies "not really" history of back problems, Hx of skin cancer (removed on face), anxiety. Denies hx of stroke, MI, seizures, DM, or parkinson's.    Examination-Activity Limitations  Bathing;Hygiene/Grooming;Squat;Stairs;Lift;Bed Mobility;Bend;Locomotion  Level;Stand;Caring for [employer];Toileting;Carry;Transfers;Dressing;Sleep;Other   difficulty with ADLs, IADLs, housework, cooking, bathing, sleeping, difficulty getting up from chair or floor, difficulty walking, high fall risk, frequent falls with injury.   Examination-Participation Restrictions  Shop;Cleaning;Community Activity;Meal Prep;Yard Work;Driving;Interpersonal Relationship    Stability/Clinical Decision Making  Evolving/Moderate complexity    Rehab Potential  Good    PT Frequency  2x / week    PT Duration  12 weeks    PT Treatment/Interventions  ADLs/Self Care Home Management;Aquatic Therapy;Canalith Repostioning;Cryotherapy;Moist Heat;Electrical Stimulation;Traction;DME Instruction;Gait training;Stair training;Functional mobility training;Therapeutic activities;Therapeutic exercise;Balance training;Neuromuscular re-education;Patient/family education;Manual techniques;Passive range of motion;Dry needling;Spinal Manipulations;Joint Manipulations;Other (comment)   joint mobilizations grades I-IV   PT Next Visit Plan  update HEP, planning for possible discharge    PT Home Exercise Plan  Medbridge  Access Code: [email or web address]    Consulted and Agree with Plan of Care  Patient       Patient will benefit from skilled therapeutic intervention in order to improve the following deficits and impairments:  Abnormal gait, Decreased knowledge of use of DME, Impaired sensation, Improper body mechanics, Pain, Decreased coordination, Decreased mobility, Postural dysfunction, Decreased activity tolerance, Decreased endurance, Decreased range of motion, Decreased strength, Hypomobility, Impaired perceived functional ability, Obesity, Impaired flexibility, Increased edema, Difficulty walking, Decreased safety awareness, Decreased balance  Visit Diagnosis: Muscle weakness (generalized)  Chronic bilateral low back pain with sciatica, sciatica laterality unspecified  Repeated falls  Tremor  Pain  in right hip  Pain in left hip     Problem List Patient Active Problem List   Diagnosis Date Noted  . Hyperlipidemia 04/02/2018  . Essential hypertension 04/02/2018  . Atrial fibrillation ([place]) 04/02/2018  . Pain in limb 04/02/2018  . Personal history of tobacco use, presenting hazards to health 12/31/2017   Sherrilyn Rist, SPT 10/13/18, 8:35 PM  Everlean Alstrom. Graylon Good, PT, DPT 10/13/18, 8:35 PM  [facility] [address], [place], [address] Phone: [phone]   Fax:  [phone]  Name: Jeremiah Bell MRN: [id number] Date of Birth: 1940-04-22

## 2018-10-18 ENCOUNTER — Other Ambulatory Visit: Payer: Self-pay

## 2018-10-18 ENCOUNTER — Encounter: Payer: Self-pay | Admitting: Physical Therapy

## 2018-10-18 ENCOUNTER — Ambulatory Visit: Payer: Medicare Other | Attending: Internal Medicine | Admitting: Physical Therapy

## 2018-10-18 DIAGNOSIS — G8929 Other chronic pain: Secondary | ICD-10-CM | POA: Diagnosis present

## 2018-10-18 DIAGNOSIS — M25552 Pain in left hip: Secondary | ICD-10-CM

## 2018-10-18 DIAGNOSIS — M6281 Muscle weakness (generalized): Secondary | ICD-10-CM

## 2018-10-18 DIAGNOSIS — R296 Repeated falls: Secondary | ICD-10-CM | POA: Diagnosis present

## 2018-10-18 DIAGNOSIS — M25551 Pain in right hip: Secondary | ICD-10-CM | POA: Diagnosis present

## 2018-10-18 DIAGNOSIS — M5440 Lumbago with sciatica, unspecified side: Secondary | ICD-10-CM | POA: Insufficient documentation

## 2018-10-18 DIAGNOSIS — M5441 Lumbago with sciatica, right side: Secondary | ICD-10-CM

## 2018-10-18 DIAGNOSIS — R251 Tremor, unspecified: Secondary | ICD-10-CM

## 2018-10-18 NOTE — Therapy (Signed)
[facility] [address] [place], [address] Phone: [phone]   Fax:  [phone]  Physical Therapy Treatment  Patient Details  Name: Jeremiah Bell MRN: [id number] Date of Birth: 05-Jul-1940 Referring Provider (PT): Tracie Harrier, MD   Encounter Date: 10/18/2018  PT End of Session - 10/18/18 1849    Visit Number  11    Number of Visits  24    Date for PT Re-Evaluation  11/29/18    Authorization Type  Medicare reporting period from 09/06/2018    Authorization Time Period  Current Cert period: 2/48/2500 - 11/29/2018 (latest PN: IE 09/06/2018);    Authorization - Visit Number  1    Authorization - Number of Visits  2    PT Start Time  1351    PT Stop Time  1432    PT Time Calculation (min)  41 min    Equipment Utilized During Treatment  Gait belt    Activity Tolerance  Patient tolerated treatment well;Patient limited by fatigue    Behavior During Therapy  WFL for tasks assessed/performed       Past Medical History:  Diagnosis Date  . A-fib ([place])   . Anxiety   . BPH (benign prostatic hyperplasia)   . GERD (gastroesophageal reflux disease)   . Hx of basal cell carcinoma 01/22/2015   L crown of scalp  . Hx of squamous cell carcinoma of skin 01/28/2016   L distal pretibial, R medial cheek  . Hyperlipidemia   . Hypertension   . Irritable bowel     Past Surgical History:  Procedure Laterality Date  . BASAL CELL CARCINOMA EXCISION Left 01/22/2015   crown of scalp  . COLONOSCOPY WITH PROPOFOL N/A 01/26/2018   Procedure: COLONOSCOPY WITH PROPOFOL;  Surgeon: Toledo, Benay Pike, MD;  Location: [facility] ENDOSCOPY;  Service: Gastroenterology;  Laterality: N/A;    There were no vitals filed for this visit.  Subjective Assessment - 10/18/18 1400    Subjective  Patient reports 1-2/10 pain at his R hip and minor discomfort at his L hip. He states no fall since last visits and felt pretty rested today.  Patient also states he worked out pretty hard on the past Saturady with lifting/organizing heavy subjects and he could barely walking on night of the Saturday but he felt pretty good today with a good rest on Sunday.    Pertinent History  Patient is a 78 y.o. male who presents to outpatient physical therapy with a referral for medical diagnosis weakness of both lower extremities, ambulatory dysfunction, history of recent fall. This patient's chief complaints consist of pain in right hip and leg, LE weakness, difficulty walking, and impaired balance leading to the following functional deficits: difficulty with ADLs, IADLs, housework, cooking, bathing, sleeping, difficulty getting up from chair or floor, difficulty walking, high fall risk, frequent falls with injury. 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 Denies hx of stroke, MI, seizures, DM, or parkinson's.    Limitations  House hold activities;Lifting;Standing;Walking;Other (comment)   difficulty with ADLs, IADLs, housework, cooking, bathing, sleeping, difficulty getting up from chair or floor, difficulty walking, high fall  risk, frequent falls with injury   How long can you sit comfortably?  not a major limitation    How long can you stand comfortably?  depends on time of day, in the morning cannot walk for 40 min after attempting to get up.    How long can you walk comfortably?  depends on time of day, in the morning cannot walk for 40 min after attempting to get up.    Diagnostic tests  MRI R hip from 07/19/2018: "IMPRESSION: 1. Inflammation superficial and deep to the iliotibial band adjacent to the  right greater trochanter. No defined fluid in the bursal space. 2. Diffuse degeneration of the superolateral and anterior aspects of the right acetabular labrum consistent with intrasubstance tears. 3. 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    Currently in Pain?  Yes    Pain Score  2     Pain Location  Hip    Pain Orientation  Right    Pain Descriptors / Indicators  Constant;Aching    Pain Type  Chronic pain    Pain Onset  More than a month ago   per documentation      CGA to Min Aprovided throughout the session.  Therapeutic exercise:to centralize symptoms and improve ROM, strength, muscular endurance, and activity tolerance required for successful completion of functional activities. -6 minutes walking with SBA to CGA for safety. For improved extremity mobility, muscular endurance, and activity tolerance; and to induce the analgesic effect of aerobic exercise, stimulate improved joint nutrition, and prepare body structures and systems for following interventions. - Sit <> stand: (to improve functional mobility and BLE strength/edurance to reduce fall risk)  With B UEcross over chest,1x10 reps  Withholding 4lb weight each hand and shoulder flexion to available range in standing. 1x10 reps  CGA to MinA to support from sitting to standing.    Neuromuscular Re-education: to improve, balance, postural strength, muscle activation patterns, and stabilization strength required for functional activities: - Dynamic balance training: (CGA to MinA throughout the session, 2lb of ankle weights on each LE)  - with 8 yellow hurdles displayed diagonally x2 reps  - with 8 yellow hurdles and airex x2 reps (Difficulties with stepping out airex) - Patient able to finished the tasks but demonstrated reduced performance when added cognitive challenges such as talking/waving hand to another  therapist.  - Extensive breaks offered throughout the session due to fatigue and weakness at his whole body, pain at his R and L hip per patient reports.  - Patient was educated on his current condition as well prognosis of PT treatment.     HOME EXERCISE PROGRAM Access Code: [facility]  URL: [email or web address]  Date: 09/16/2018  Prepared by: Rosita Kea   Exercises Sit to Stand- 2 sets- 5 reps- 2x daily- 7x weekly  Side Stepping with Counter Support- 2 sets- 10 reps-2x daily- 7x weekly  Patient tolerated the session well without increase of R hip pain. Patient continue demonstrating improvements B feet clearance during step over hurdle exercise but showing consistent impaired task performance (reduced hip flexion) when adding cognitive challenges (talking to another therapist). Patient presented good muscle activation with sit to stand functional tests without cuing for timing pressure(reduced cognitive challenging). Patient would benefit continued physical therapy but patient states due to multiple medical appointments, patient still want to finish this episode of care in  next visit as final session. Will continue strength, neuro-reeducation, long term HEP in upcoming session to improve postural stability, reduce fall risk, and pain control at R hip.      PT Education - 10/18/18 1849    Education Details  Exercise purpose/form. Self management techniques    Person(s) Educated  Patient    Methods  Explanation;Demonstration;Verbal cues;Tactile cues    Comprehension  Verbalized understanding;Returned demonstration;Verbal cues required;Tactile cues required       PT Short Term Goals - 10/13/18 2032      PT SHORT TERM GOAL #1   Title  Be independent with initial home exercise program for self-management of symptoms.    Baseline  initial HEP to be provided at visit 2 (09/06/2018); cannot perfrom constantly at home  (10/13/2018)    Time  2    Period  Weeks     Status  On-going    Target Date  09/20/18      PT SHORT TERM GOAL #2   Title  Improve 6MWT to equal or greater than 1200 feet to demonstrate improved activity tolerance for community ambulation.    Baseline  1042 feet with no AD and CGA with multiple stumbles and grabbing onto things (09/06/2018); 950 feet with cane and CGA. improved foot dragging and postral sway (10/13/2018)    Time  6    Period  Weeks    Status  On-going    Target Date  10/18/18      PT SHORT TERM GOAL #3   Title  Patient will improve 5TSTS test to equal or less than 20 seconds to demonstrate improved ability to complete transfers and move around home safely.    Baseline  27.5 with BUE support (09/06/2018); 15s but unable to attempt when PT tells the patient to perform under timed or pressured  (10/13/2018)    Time  6    Period  Weeks    Status  On-going    Target Date  10/18/18      PT SHORT TERM GOAL #4   Title  Patient will improve by at least one point for each of the items on PSFS to show improvement in self-reported function:    Baseline  Walking: 7, moving after sleeping: 0, hip is better: 6 (09/06/2018); Walking: 8/10; Moving around following sleep: 8/10; Hip is better: 7/10    Time  6    Period  Weeks    Status  On-going    Target Date  10/18/18        PT Long Term Goals - 10/13/18 2024      PT LONG TERM GOAL #1   Title  Be independent with a long-term home exercise program for self-management of symptoms.    Baseline  initial HEP to be provided at visit 2 (09/06/2018); cannot perfrom constantly at home  (10/13/2018)    Time  12    Period  Weeks    Status  On-going    Target Date  11/29/18      PT LONG TERM GOAL #2   Title  Improve 6MWT to equal or greater than 1400 feet to demonstrate improved activity tolerance for community ambulation.    Baseline  1042 feet with no AD and CGA with multiple stumbles and grabbing onto things (09/06/2018); 950 feet with cane and CGA. improved foot dragging and  postral sway (10/13/2018)    Time  12    Period  Weeks    Status  On-going  Target Date  11/29/18      PT LONG TERM GOAL #3   Title  Patient will improve 5TSTS test to equal or less than 20 seconds with no UE support to demonstrate improved ability to complete transfers and move around home safely.    Baseline  27.5 with BUE support (09/06/2018); 15s but unable to attempt when PT tells the patient to perform under timed or pressured  (10/13/2018)    Time  12    Period  Weeks    Status  On-going    Target Date  11/29/18      PT LONG TERM GOAL #4   Title  Patient will improve to equal or greater than 8/10 on each of the items on PSFS to show improvement in self-reported function.    Baseline  Walking: 7, moving after sleeping: 0, hip is better: 6 (09/06/2018); Walking: 8/10; Moving around following sleep: 8/10; Hip is better: 7/10(10/13/2018)    Time  12    Period  Weeks    Status  On-going    Target Date  11/29/18      PT LONG TERM GOAL #5   Title  Patient will score greater than 19/30 on Functional Gait Assessment to demonstrate moderate or low fall risk.    Baseline  measurement deferred due to fatigue (09/06/2018); 20/30 with CGA and cane (10/13/2018)    Time  12    Period  Weeks    Status  Partially Met    Target Date  11/29/18            Plan - 10/18/18 1850    Clinical Impression Statement  Patient tolerated the session well without increase of R hip pain. Patient continue demonstrating improvements B feet clearance during step over hurdle exercise but showing consistent impaired task performance (reduced hip flexion) when adding cognitive challenges (talking to another therapist). Patient presented good muscle activation with sit to stand functional tests without cuing for timing pressure(reduced cognitive challenging). Patient would benefit continued physical therapy but patient states due to multiple medical appointments, patient still want to finish this episode of care  in next visit as final session. Will continue strength, neuro-reeducation, long term HEP in upcoming session to improve postural stability, reduce fall risk, and pain control at R hip.    Personal Factors and Comorbidities  Age;Time since onset of injury/illness/exacerbation;Behavior Pattern;Comorbidity 3+;Fitness;Past/Current Experience    Comorbidities  a-fib (on warfarin), 6 weeks ago where he thought he was having a heart attack and he went to the heart attack and it was determined to be A-fib, HTN, fatty liver (decreased drinking), hyperlipidemia, neuropathy in both legs (wears compression socks), denies "not really" history of back problems, Hx of skin cancer (removed on face), anxiety. Denies hx of stroke, MI, seizures, DM, or parkinson's.    Examination-Activity Limitations  Bathing;Hygiene/Grooming;Squat;Stairs;Lift;Bed Mobility;Bend;Locomotion Level;Stand;Caring for [employer];Toileting;Carry;Transfers;Dressing;Sleep;Other   difficulty with ADLs, IADLs, housework, cooking, bathing, sleeping, difficulty getting up from chair or floor, difficulty walking, high fall risk, frequent falls with injury.   Examination-Participation Restrictions  Shop;Cleaning;Community Activity;Meal Prep;Yard Work;Driving;Interpersonal Relationship    Stability/Clinical Decision Making  Evolving/Moderate complexity    Rehab Potential  Good    PT Frequency  2x / week    PT Duration  12 weeks    PT Treatment/Interventions  ADLs/Self Care Home Management;Aquatic Therapy;Canalith Repostioning;Cryotherapy;Moist Heat;Electrical Stimulation;Traction;DME Instruction;Gait training;Stair training;Functional mobility training;Therapeutic activities;Therapeutic exercise;Balance training;Neuromuscular re-education;Patient/family education;Manual techniques;Passive range of motion;Dry needling;Spinal Manipulations;Joint Manipulations;Other (comment)   joint mobilizations grades I-IV  PT Next Visit Plan  update HEP,  planning for possible discharge    PT Home Exercise Plan  Medbridge  Access Code: [email or web address]    Consulted and Agree with Plan of Care  Patient       Patient will benefit from skilled therapeutic intervention in order to improve the following deficits and impairments:  Abnormal gait, Decreased knowledge of use of DME, Impaired sensation, Improper body mechanics, Pain, Decreased coordination, Decreased mobility, Postural dysfunction, Decreased activity tolerance, Decreased endurance, Decreased range of motion, Decreased strength, Hypomobility, Impaired perceived functional ability, Obesity, Impaired flexibility, Increased edema, Difficulty walking, Decreased safety awareness, Decreased balance  Visit Diagnosis: Muscle weakness (generalized)  Chronic bilateral low back pain with sciatica, sciatica laterality unspecified  Repeated falls  Tremor  Pain in right hip  Pain in left hip     Problem List Patient Active Problem List   Diagnosis Date Noted  . Hyperlipidemia 04/02/2018  . Essential hypertension 04/02/2018  . Atrial fibrillation ([place]) 04/02/2018  . Pain in limb 04/02/2018  . Personal history of tobacco use, presenting hazards to health 12/31/2017    .Sherrilyn Rist, SPT 10/18/18, 7:32 PM  Everlean Alstrom. Graylon Good, PT, DPT 10/18/18, 7:34 PM   [facility] [address] [place], [address] Phone: [phone]   Fax:  [phone]  Name: LONDELL NOLL MRN: [id number] Date of Birth: [birth year]

## 2018-10-20 ENCOUNTER — Encounter: Payer: Self-pay | Admitting: Physical Therapy

## 2018-10-20 ENCOUNTER — Ambulatory Visit: Payer: Medicare Other | Admitting: Physical Therapy

## 2018-10-20 ENCOUNTER — Other Ambulatory Visit: Payer: Self-pay

## 2018-10-20 DIAGNOSIS — R251 Tremor, unspecified: Secondary | ICD-10-CM

## 2018-10-20 DIAGNOSIS — R296 Repeated falls: Secondary | ICD-10-CM

## 2018-10-20 DIAGNOSIS — M5442 Lumbago with sciatica, left side: Secondary | ICD-10-CM

## 2018-10-20 DIAGNOSIS — M5440 Lumbago with sciatica, unspecified side: Secondary | ICD-10-CM

## 2018-10-20 DIAGNOSIS — M25551 Pain in right hip: Secondary | ICD-10-CM

## 2018-10-20 DIAGNOSIS — M25552 Pain in left hip: Secondary | ICD-10-CM

## 2018-10-20 DIAGNOSIS — M6281 Muscle weakness (generalized): Secondary | ICD-10-CM

## 2018-10-20 DIAGNOSIS — G8929 Other chronic pain: Secondary | ICD-10-CM

## 2018-10-20 NOTE — Therapy (Signed)
[facility] [address] [place], [address] Phone: [phone]   Fax:  [phone]  Physical Therapy Treatment/ Discharge Note  Reporting from 09/06/2018 to 10/20/2018  Patient Details  Name: Jeremiah Bell MRN: [id number] Date of Birth: 04/29/1940 Referring Provider (PT): Tracie Harrier, MD   Encounter Date: 10/20/2018  PT End of Session - 10/20/18 1635    Visit Number  12    Number of Visits  24    Date for PT Re-Evaluation  11/29/18    Authorization Type  Medicare reporting period from 09/06/2018    Authorization Time Period  Current Cert period: 2/63/7858 - 11/29/2018 (latest PN: IE 09/06/2018);    Authorization - Visit Number  2    Authorization - Number of Visits  2    PT Start Time  8502    PT Stop Time  1434    PT Time Calculation (min)  40 min    Equipment Utilized During Treatment  Gait belt    Activity Tolerance  Patient tolerated treatment well;Patient limited by fatigue    Behavior During Therapy  WFL for tasks assessed/performed       Past Medical History:  Diagnosis Date  . A-fib ([place])   . Anxiety   . BPH (benign prostatic hyperplasia)   . GERD (gastroesophageal reflux disease)   . Hx of basal cell carcinoma 01/22/2015   L crown of scalp  . Hx of squamous cell carcinoma of skin 01/28/2016   L distal pretibial, R medial cheek  . Hyperlipidemia   . Hypertension   . Irritable bowel     Past Surgical History:  Procedure Laterality Date  . BASAL CELL CARCINOMA EXCISION Left 01/22/2015   crown of scalp  . COLONOSCOPY WITH PROPOFOL N/A 01/26/2018   Procedure: COLONOSCOPY WITH PROPOFOL;  Surgeon: Toledo, Benay Pike, MD;  Location: [facility] ENDOSCOPY;  Service: Gastroenterology;  Laterality: N/A;    There were no vitals filed for this visit.  Subjective Assessment - 10/20/18 1633    Subjective  Patient reports no pain at his bilateral hips and had good sleep last night. He felt  pretty good today and has been eating regularly. States he desires today to be his last PT visit due to feeling he need more time to pursue further care for other medical issues such as foot pain    Pertinent History  Patient is a 78 y.o. male who presents to outpatient physical therapy with a referral for medical diagnosis weakness of both lower extremities, ambulatory dysfunction, history of recent fall. This patient's chief complaints consist of pain in right hip and leg, LE weakness, difficulty walking, and impaired balance leading to the following functional deficits: difficulty with ADLs, IADLs, housework, cooking, bathing, sleeping, difficulty getting up from chair or floor, difficulty walking, high fall risk, frequent falls with injury. 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 Denies hx of stroke, MI, seizures, DM, or parkinson's.    Limitations  House hold activities;Lifting;Standing;Walking;Other (comment)   difficulty with ADLs, IADLs, housework, cooking, bathing, sleeping, difficulty getting up from chair or floor, difficulty walking, high fall risk, frequent falls with  injury   How long can you sit comfortably?  not a major limitation    How long can you stand comfortably?  depends on time of day, in the morning cannot walk for 40 min after attempting to get up.    How long can you walk comfortably?  depends on time of day, in the morning cannot walk for 40 min after attempting to get up.    Diagnostic tests  MRI R hip from 07/19/2018: "IMPRESSION: 1. Inflammation superficial and deep to the iliotibial band adjacent to the right greater  trochanter. No defined fluid in the bursal space. 2. Diffuse degeneration of the superolateral and anterior aspects of the right acetabular labrum consistent with intrasubstance tears. 3. 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    Currently in Pain?  No/denies    Pain Onset  More than a month ago   per documentation       [facility] PT Assessment - 10/20/18 0001      Functional Gait  Assessment   Gait assessed   Yes    Gait Level Surface  Walks 20 ft in less than 7 sec but greater than 5.5 sec, uses assistive device, slower speed, mild gait deviations, or deviates 6-10 in outside of the 12 in walkway width.   with cane   Change in Gait Speed  Able to change speed, demonstrates mild gait deviations, deviates 6-10 in outside of the 12 in walkway width, or no gait deviations, unable to achieve a major change in velocity, or uses a change in velocity, or uses an assistive device.   with cane   Gait with Horizontal Head Turns  Performs head turns smoothly with slight change in gait velocity (eg, minor disruption to smooth gait path), deviates 6-10 in outside 12 in walkway width, or uses an assistive device.   with cane   Gait with Vertical Head Turns  Performs task with slight change in gait velocity (eg, minor disruption to smooth gait path), deviates 6 - 10 in outside 12 in walkway width or uses assistive device   with cane    Gait and Pivot Turn  Pivot turns safely in greater than 3 sec and stops with no loss of balance, or pivot turns safely within 3 sec and stops with mild imbalance, requires small steps to catch balance.   with cane   Step Over Obstacle  Is able to step over one shoe box (4.5 in total height) without changing gait speed. No evidence of imbalance.   with cane    Gait with Narrow Base of Support  Ambulates 7-9 steps.   At treadmill bars and one UE support intermittenly   Gait  with Eyes Closed  Walks 20 ft, uses assistive device, slower speed, mild gait deviations, deviates 6-10 in outside 12 in walkway width. Ambulates 20 ft in less than 9 sec but greater than 7 sec.   with cane   Ambulating Backwards  Walks 20 ft, uses assistive device, slower speed, mild gait deviations, deviates 6-10 in outside 12 in walkway width.   with cane   Steps  Alternating feet, must use rail.   with cane   Total Score  20    FGA comment:  Last measured 10/12/2000       OBJECTIVE: Latest measurements from recent progress visit 10/13/2018:  PATIENT REPORTED OUTCOMES:  Patient Specific Functional Scale:  1. Walking: 8/10 2. Moving around following sleep: 8/10 3. Hip is better: 7/10  FUNCTIONAL MOBILITY:  Bed mobility:deferred.  Transfers:sit <> stand transfer without UE support. Have difficulties to stand up when under pressure when knowing he is under tested or timed.   Gait:Ambulated with bilateral foot drag and with cane after 5 minutes walking. CGA provided to prevent fall.   Stairs:BUE support at railings with alternating steps.   FUNCTIONAL/BALANCE TESTS:  Six Minute Walk Test:950 feet with cane and CGA. No sign of loss balance during the test. Patient presented increased fatigue at the end by increased foot dragging bilaterally. More on the R side foot  5TSTS: 15s without BUE support, CGA from 19.5 inch plinth, when not cued that he is being timed.Unable to complete when he was told he was being timed. Becomes flustered and demo poor mechanics resulting in unable to rise when he is feeling time pressure.    TREATMENT:   CGA to SBAprovided throughout the session.  Therapeutic exercise:to centralize symptoms and improve ROM, strength, muscular endurance, and activity tolerance required for successful completion of functional activities. -6 minutes walking with SBA to CGA for safety. For improved extremity mobility, muscular endurance, and activity  tolerance; and to induce the analgesic effect of aerobic exercise, stimulate improved joint nutrition, and prepare body structures and systems for following interventions. - Sit <> stand: (to improve functional mobility and BLE strength/edurance to reduce fall risk)  With B UEcross over chest,1x10 reps SBA and patient able to complete this task without unsuccessful attempts.  - High knee march 2 minutes in place with one UE holding on bar at treadmill. Patient demonstrated reduced ROM  - Side stepping 2 x 49fet each direction with BUEs holding on bar at treadmill to increase step length, balance, and reduce fall risk.  - Standing hip extension R/L x 10 reps each side  - Standing hip abduction R/L x 10 reps each side  - Cuing provided to improve proper form and techniques.  -Patient was educated onhis current condition as well as prognosis of PT treatment. - Education on long-term HEP including handout    HOME EXERCISE PROGRAM Access Code: [email or web address] URL: [email or web address]  Date: 10/20/2018  Prepared by: SRosita Kea  Exercises  Sit to Stand - 2 sets - 5 reps - 2x daily - 7x weekly  Side Stepping with Counter Support - 2 sets - 10 reps - 1x daily - 7x weekly  Standing Hip Abduction with Counter Support - 2 sets - 10 reps - 1x daily - 7x weekly  Standing Hip Extension with Counter Support - 2 sets - 10 reps - 1x daily - 7x weekly  Standing March with Counter Support - 1 sets - 2 minutes - 1x daily - 7x weekly   Patient tolerated the sessionwell without R hip pain. Patient demonstrated improved motor control and increased activity tolerance. Patient demonstrated good understanding with his updated HEP and able to complete them at clinic. Additional time devoted to education on importance of HEP, maintain gain of physical therapy, and fall prevention. Patient presented appreciation to his therapy sessions and expressed willingness to return to therapy as recommended  after he solve his concerns of his feet pain. This is patient's last PT session and will be discharged from this episode of care.  Patient attended 12 physical therapy treatment sessions over this episode of care and overall presented with good progress towards goals. Patient demonstrate good progression in  PSFS, improved body mechanics during 6MWT, and improved LE strength and power. Patient continues demonstrate impaired balance, difficulty with motor control including timing and weight shifts for transfers, decreased endurance, and pain that negatively impactshis safety and ability to complete transfers, household and community mobility, ADLs, IADLs, and usual activities and decreases his quality of life. Patient wishes to discharge despite physical therapist's recommendation he continue physical therapy to continue addressing these issues. Patient is now discharged from physical therapy per self-discharge.      PT Education - 10/20/18 1409    Education Details  Exercise purpose/form. Self management techniques, updated long term HEP    Person(s) Educated  Patient    Methods  Explanation;Demonstration;Tactile cues;Verbal cues    Comprehension  Verbalized understanding;Returned demonstration;Verbal cues required;Tactile cues required       PT Short Term Goals - 10/20/18 1956      PT SHORT TERM GOAL #1   Title  Be independent with initial home exercise program for self-management of symptoms.    Baseline  initial HEP to be provided at visit 2 (09/06/2018); cannot perfrom constantly at home  (10/13/2018)    Time  2    Period  Weeks    Status  Achieved    Target Date  09/20/18      PT SHORT TERM GOAL #2   Title  Improve 6MWT to equal or greater than 1200 feet to demonstrate improved activity tolerance for community ambulation.    Baseline  1042 feet with no AD and CGA with multiple stumbles and grabbing onto things (09/06/2018); 950 feet with cane and CGA. improved foot dragging and postral  sway (10/13/2018)    Time  6    Period  Weeks    Status  Not Met    Target Date  10/18/18      PT SHORT TERM GOAL #3   Title  Patient will improve 5TSTS test to equal or less than 20 seconds to demonstrate improved ability to complete transfers and move around home safely.    Baseline  27.5 with BUE support (09/06/2018); 15s but unable to attempt when PT tells the patient to perform under timed or pressured  (10/13/2018)    Time  6    Period  Weeks    Status  Partially Met    Target Date  10/18/18      PT SHORT TERM GOAL #4   Title  Patient will improve by at least one point for each of the items on PSFS to show improvement in self-reported function:    Baseline  Walking: 7, moving after sleeping: 0, hip is better: 6 (09/06/2018); Walking: 8/10; Moving around following sleep: 8/10; Hip is better: 7/10    Time  6    Period  Weeks    Status  Achieved    Target Date  10/18/18        PT Long Term Goals - 10/20/18 1944      PT LONG TERM GOAL #1   Title  Be independent with a long-term home exercise program for self-management of symptoms.    Baseline  initial HEP to be provided at visit 2 (09/06/2018); cannot perfrom constantly at home  (10/13/2018); HEP updated on 10/20/2018    Time  12    Period  Weeks    Status  Achieved    Target Date  11/29/18      PT LONG TERM GOAL #2   Title  Improve 6MWT to equal or greater than 1400  feet to demonstrate improved activity tolerance for community ambulation.    Baseline  1042 feet with no AD and CGA with multiple stumbles and grabbing onto things (09/06/2018); 950 feet with cane and CGA. improved foot dragging and postral sway (10/13/2018)    Time  12    Period  Weeks    Status  Not Met    Target Date  11/29/18      PT LONG TERM GOAL #3   Title  Patient will improve 5TSTS test to equal or less than 20 seconds with no UE support to demonstrate improved ability to complete transfers and move around home safely.    Baseline  27.5 with BUE  support (09/06/2018); 15s but unable to attempt when PT tells the patient to perform under timed or pressured  (10/13/2018)    Time  12    Period  Weeks    Status  Partially Met    Target Date  11/29/18      PT LONG TERM GOAL #4   Title  Patient will improve to equal or greater than 8/10 on each of the items on PSFS to show improvement in self-reported function.    Baseline  Walking: 7, moving after sleeping: 0, hip is better: 6 (09/06/2018); Walking: 8/10; Moving around following sleep: 8/10; Hip is better: 7/10(10/13/2018)    Time  12    Period  Weeks    Status  Partially Met    Target Date  11/29/18      PT LONG TERM GOAL #5   Title  Patient will score greater than 19/30 on Functional Gait Assessment to demonstrate moderate or low fall risk.    Baseline  measurement deferred due to fatigue (09/06/2018); 20/30 with CGA and cane (10/13/2018)    Time  12    Period  Weeks    Status  Achieved    Target Date  11/29/18            Plan - 10/20/18 1637    Clinical Impression Statement  Patient tolerated the session well without R hip pain. Patient demonstrated improved motor control and increased activity tolerance. Patient demonstrated good understanding with his updated HEP and able to complete them at clinic. Additional time devoted to education on importance of HEP, maintain gain of physical therapy, and fall prevention. Patient presented appreciation to his therapy sessions and expressed willingness to return to therapy as recommended after he solve his concerns of his feet pain. This is patient's last PT session and will be discharged from this episode of care.  Patient attended 12 physical therapy treatment sessions over this episode of care and overall presented with good progress towards goals. Patient demonstrate good progression in PSFS, improved body mechanics during 6MWT, and improved LE strength and power. Patient continues demonstrate impaired balance, difficulty with motor  control including timing and weight shifts for transfers, decreased endurance, and pain that negatively impacts his safety and ability to complete transfers, household and community mobility, ADLs, IADLs, and usual activities and decreases his quality of life. Patient wishes to discharge despite physical therapist's recommendation he continue physical therapy to continue addressing these issues. Patient is now discharged from physical therapy per self-discharge.    Personal Factors and Comorbidities  Age;Time since onset of injury/illness/exacerbation;Behavior Pattern;Comorbidity 3+;Fitness;Past/Current Experience    Comorbidities  a-fib (on warfarin), 6 weeks ago where he thought he was having a heart attack and he went to the heart attack and it was determined to be A-fib, HTN,  fatty liver (decreased drinking), hyperlipidemia, neuropathy in both legs (wears compression socks), denies "not really" history of back problems, Hx of skin cancer (removed on face), anxiety. Denies hx of stroke, MI, seizures, DM, or parkinson's.    Examination-Activity Limitations  Bathing;Hygiene/Grooming;Squat;Stairs;Lift;Bed Mobility;Bend;Locomotion Level;Stand;Caring for [employer];Toileting;Carry;Transfers;Dressing;Sleep;Other   difficulty with ADLs, IADLs, housework, cooking, bathing, sleeping, difficulty getting up from chair or floor, difficulty walking, high fall risk, frequent falls with injury.   Examination-Participation Restrictions  Shop;Cleaning;Community Activity;Meal Prep;Yard Work;Driving;Interpersonal Relationship    Stability/Clinical Decision Making  Evolving/Moderate complexity    Rehab Potential  Good    PT Frequency  2x / week    PT Duration  12 weeks    PT Treatment/Interventions  ADLs/Self Care Home Management;Aquatic Therapy;Canalith Repostioning;Cryotherapy;Moist Heat;Electrical Stimulation;Traction;DME Instruction;Gait training;Stair training;Functional mobility training;Therapeutic  activities;Therapeutic exercise;Balance training;Neuromuscular re-education;Patient/family education;Manual techniques;Passive range of motion;Dry needling;Spinal Manipulations;Joint Manipulations;Other (comment)   joint mobilizations grades I-IV   PT Next Visit Plan  Patient is now discharged from physical therapy per self-discharge.    PT Home Exercise Plan  [employer]  Access Code: [email or web address]    Consulted and Agree with Plan of Care  Patient       Patient will benefit from skilled therapeutic intervention in order to improve the following deficits and impairments:  Abnormal gait, Decreased knowledge of use of DME, Impaired sensation, Improper body mechanics, Pain, Decreased coordination, Decreased mobility, Postural dysfunction, Decreased activity tolerance, Decreased endurance, Decreased range of motion, Decreased strength, Hypomobility, Impaired perceived functional ability, Obesity, Impaired flexibility, Increased edema, Difficulty walking, Decreased safety awareness, Decreased balance  Visit Diagnosis: Muscle weakness (generalized)  Chronic bilateral low back pain with sciatica, sciatica laterality unspecified  Repeated falls  Tremor  Pain in right hip  Pain in left hip     Problem List Patient Active Problem List   Diagnosis Date Noted  . Hyperlipidemia 04/02/2018  . Essential hypertension 04/02/2018  . Atrial fibrillation ([place]) 04/02/2018  . Pain in limb 04/02/2018  . Personal history of tobacco use, presenting hazards to health 12/31/2017    Sherrilyn Rist, SPT 10/20/18, 7:59 PM  Everlean Alstrom. Graylon Good, PT, DPT 10/20/18, 8:00 PM    [facility] [address] [place], [address] Phone: [phone]   Fax:  [phone]  Name: Jeremiah Bell MRN: [id number] Date of Birth: 1940/09/25

## 2018-10-25 ENCOUNTER — Encounter: Payer: Medicare Other | Admitting: Physical Therapy

## 2018-10-27 ENCOUNTER — Encounter: Payer: Medicare Other | Admitting: Physical Therapy

## 2018-11-01 ENCOUNTER — Encounter: Payer: Medicare Other | Admitting: Physical Therapy

## 2018-11-03 ENCOUNTER — Encounter: Payer: Medicare Other | Admitting: Physical Therapy

## 2018-11-08 ENCOUNTER — Encounter: Payer: Medicare Other | Admitting: Physical Therapy

## 2018-11-10 ENCOUNTER — Encounter: Payer: Medicare Other | Admitting: Physical Therapy

## 2018-11-10 ENCOUNTER — Other Ambulatory Visit: Payer: Self-pay | Admitting: Internal Medicine

## 2018-11-10 DIAGNOSIS — G8929 Other chronic pain: Secondary | ICD-10-CM

## 2018-11-10 DIAGNOSIS — M25551 Pain in right hip: Secondary | ICD-10-CM

## 2018-11-10 DIAGNOSIS — R1011 Right upper quadrant pain: Secondary | ICD-10-CM

## 2018-11-15 ENCOUNTER — Encounter: Payer: Medicare Other | Admitting: Physical Therapy

## 2018-11-17 ENCOUNTER — Encounter: Payer: Medicare Other | Admitting: Physical Therapy

## 2018-11-18 ENCOUNTER — Ambulatory Visit
Admission: RE | Admit: 2018-11-18 | Discharge: 2018-11-18 | Disposition: A | Discharge status: Home / Self Care | Payer: Medicare Other | Source: Ambulatory Visit | Attending: Internal Medicine | Admitting: Internal Medicine

## 2018-11-18 ENCOUNTER — Other Ambulatory Visit: Payer: Self-pay

## 2018-11-18 DIAGNOSIS — R1011 Right upper quadrant pain: Secondary | ICD-10-CM | POA: Insufficient documentation

## 2018-11-18 DIAGNOSIS — G8929 Other chronic pain: Secondary | ICD-10-CM | POA: Diagnosis present

## 2018-11-18 DIAGNOSIS — M25551 Pain in right hip: Secondary | ICD-10-CM | POA: Diagnosis present

## 2018-11-22 ENCOUNTER — Encounter: Payer: Medicare Other | Admitting: Physical Therapy

## 2018-11-24 ENCOUNTER — Encounter: Payer: Medicare Other | Admitting: Physical Therapy

## 2018-11-29 ENCOUNTER — Encounter: Payer: Medicare Other | Admitting: Physical Therapy

## 2018-12-01 ENCOUNTER — Encounter: Payer: Medicare Other | Admitting: Physical Therapy

## 2018-12-06 ENCOUNTER — Encounter: Payer: Medicare Other | Admitting: Physical Therapy

## 2018-12-08 ENCOUNTER — Encounter: Payer: Medicare Other | Admitting: Physical Therapy

## 2018-12-13 ENCOUNTER — Encounter: Payer: Medicare Other | Admitting: Physical Therapy

## 2018-12-19 ENCOUNTER — Encounter: Payer: Self-pay | Admitting: Intensive Care

## 2018-12-19 ENCOUNTER — Emergency Department
Admission: EM | Admit: 2018-12-19 | Discharge: 2018-12-19 | Disposition: A | Discharge status: Home / Self Care | Payer: Medicare Other | Attending: Emergency Medicine | Admitting: Emergency Medicine

## 2018-12-19 ENCOUNTER — Other Ambulatory Visit: Payer: Self-pay

## 2018-12-19 ENCOUNTER — Emergency Department: Payer: Medicare Other

## 2018-12-19 DIAGNOSIS — E785 Hyperlipidemia, unspecified: Secondary | ICD-10-CM | POA: Diagnosis not present

## 2018-12-19 DIAGNOSIS — W19XXXA Unspecified fall, initial encounter: Secondary | ICD-10-CM

## 2018-12-19 DIAGNOSIS — I4891 Unspecified atrial fibrillation: Secondary | ICD-10-CM | POA: Insufficient documentation

## 2018-12-19 DIAGNOSIS — M25559 Pain in unspecified hip: Secondary | ICD-10-CM

## 2018-12-19 DIAGNOSIS — S2231XA Fracture of one rib, right side, initial encounter for closed fracture: Secondary | ICD-10-CM | POA: Insufficient documentation

## 2018-12-19 DIAGNOSIS — S79911A Unspecified injury of right hip, initial encounter: Secondary | ICD-10-CM | POA: Diagnosis present

## 2018-12-19 DIAGNOSIS — Y9301 Activity, walking, marching and hiking: Secondary | ICD-10-CM | POA: Insufficient documentation

## 2018-12-19 DIAGNOSIS — Y9259 Other trade areas as the place of occurrence of the external cause: Secondary | ICD-10-CM | POA: Diagnosis not present

## 2018-12-19 DIAGNOSIS — F17210 Nicotine dependence, cigarettes, uncomplicated: Secondary | ICD-10-CM | POA: Insufficient documentation

## 2018-12-19 DIAGNOSIS — W101XXA Fall (on)(from) sidewalk curb, initial encounter: Secondary | ICD-10-CM | POA: Insufficient documentation

## 2018-12-19 DIAGNOSIS — Z7901 Long term (current) use of anticoagulants: Secondary | ICD-10-CM | POA: Insufficient documentation

## 2018-12-19 DIAGNOSIS — Y998 Other external cause status: Secondary | ICD-10-CM | POA: Insufficient documentation

## 2018-12-19 DIAGNOSIS — I10 Essential (primary) hypertension: Secondary | ICD-10-CM | POA: Insufficient documentation

## 2018-12-19 MED ORDER — LIDOCAINE 5 % EX PTCH
1.0000 | MEDICATED_PATCH | CUTANEOUS | Status: DC
  Administered 2018-12-19: 1 via TRANSDERMAL
  Filled 2018-12-19: qty 1

## 2018-12-19 MED ORDER — LIDOCAINE 5 % EX PTCH: 1.0000 | MEDICATED_PATCH | CUTANEOUS | 0 refills | Status: AC

## 2018-12-19 NOTE — Discharge Instructions (Signed)
Your chest x-ray looks like you may have a 5th broken rib on the right side.  Your other x-rays and your CTs do not show any fracture. Use the walker to help you walk. Please call Dr. Ginette Pitman for an appointment this week for follow up.

## 2018-12-19 NOTE — ED Notes (Signed)
Pt verbalized understanding of discharge instructions. NAD at this time. 

## 2018-12-19 NOTE — ED Notes (Signed)
Call son Saralyn Pilar when disposition decided.

## 2018-12-19 NOTE — ED Provider Notes (Signed)
[facility] Emergency Department Provider Note  ____________________________________________  Time seen: Approximately 2:08 PM  I have reviewed the triage vital signs and the nursing notes.   HISTORY  Chief Complaint Fall    HPI Jeremiah Bell is a 78 y.o. male that presents to the emergency department for evaluation after a fall 4 days ago.  Patient was at [facility] when he went to step up on a curb and fell.  He landed on his right side, primarily to his right hip.  He denies hitting his head or losing consciousness.  He has had difficulty walking since the fall due to the pain in his right hip. He is able to walk, it is just painful.  He has some soreness to his right rib cage and his right knee.  He has had some on-and-off pain to his right knee anyway so he would like an x-ray of this as well.  He uses a cane.  He is on Coumadin for a previous stroke. Patient sees Dr. Ginette Pitman.  He moved to the area from [facility] not too long ago to be closer to his children.  No headache, shortness of breath, chest pain, abdominal pain.   Past Medical History:  Diagnosis Date  . A-fib ([place])   . Anxiety   . BPH (benign prostatic hyperplasia)   . GERD (gastroesophageal reflux disease)   . Hx of basal cell carcinoma 01/22/2015   L crown of scalp  . Hx of squamous cell carcinoma of skin 01/28/2016   L distal pretibial, R medial cheek  . Hyperlipidemia   . Hypertension   . Irritable bowel     Patient Active Problem List   Diagnosis Date Noted  . Hyperlipidemia 04/02/2018  . Essential hypertension 04/02/2018  . Atrial fibrillation ([place]) 04/02/2018  . Pain in limb 04/02/2018  . Personal history of tobacco use, presenting hazards to health 12/31/2017    Past Surgical History:  Procedure Laterality Date  . BASAL CELL CARCINOMA EXCISION Left 01/22/2015   crown of scalp  . COLONOSCOPY WITH PROPOFOL N/A 01/26/2018   Procedure: COLONOSCOPY WITH PROPOFOL;  Surgeon:  Toledo, Benay Pike, MD;  Location: [facility] ENDOSCOPY;  Service: Gastroenterology;  Laterality: N/A;    Prior to Admission medications   Medication Sig Start Date End Date Taking? Authorizing Provider  albuterol (PROVENTIL HFA;VENTOLIN HFA) 108 (90 Base) MCG/ACT inhaler Inhale 2 puffs into the lungs every 6 (six) hours as needed for wheezing or shortness of breath. 04/21/16   Nance Pear, MD  ALPRAZolam Duanne Moron) 1 MG tablet Take 1 mg by mouth 2 (two) times daily as needed for anxiety.    [provider]  dicyclomine (BENTYL) 10 MG capsule Take 10 mg by mouth 4 (four) times daily as needed. 04/10/16   [provider]  flecainide (TAMBOCOR) 50 MG tablet Take 50 mg by mouth 2 (two) times daily. 04/09/16   [provider]  HYDROcodone-acetaminophen (NORCO/VICODIN) 5-325 MG tablet Take 1 tablet by mouth every 6 (six) hours as needed for moderate pain. 07/19/18   Delman Kitten, MD  lidocaine (LIDODERM) 5 % Place 1 patch onto the skin daily. Remove & Discard patch within 12 hours or as directed by MD 12/19/18   Laban Emperor, PA-C  omeprazole (PRILOSEC) 40 MG capsule Take 40 mg by mouth daily. 04/10/16   [provider]  predniSONE (DELTASONE) 20 MG tablet Take 2 tablets (40 mg total) by mouth daily. 07/19/18   Delman Kitten, MD  senna-docusate (  SENOKOT-S) 8.6-50 MG tablet Take 1 tablet by mouth 2 (two) times daily.    [provider]  simvastatin (ZOCOR) 40 MG tablet Take 40 mg by mouth daily.    [provider]  tamsulosin (FLOMAX) 0.4 MG CAPS capsule Take 0.4 mg by mouth 2 (two) times daily. 04/10/16   [provider]  torsemide (DEMADEX) 10 MG tablet Take 30 mg by mouth daily.    [provider]  traZODone (DESYREL) 50 MG tablet Take 50 mg by mouth at bedtime.    [provider]  vitamin B-12 (CYANOCOBALAMIN) 1000 MCG tablet Take 1,000 mcg by mouth daily.    [provider]  warfarin (COUMADIN) 1 MG tablet Take 1 mg by mouth  daily.    [provider]  warfarin (COUMADIN) 5 MG tablet Take 5 mg by mouth daily.    [provider]    Allergies Erythromycin, Sulfa antibiotics, and Terramycin [oxytetracycline]  Family History  Problem Relation Age of Onset  . Suicidality Mother   . Vascular Disease Mother   . Alzheimer's disease Father   . Suicidality Paternal Grandfather     Social History Social History   Tobacco Use  . Smoking status: Current Every Day Smoker    Packs/day: 1.00    Years: 57.00    Pack years: 57.00    Types: Cigarettes  . Smokeless tobacco: Never Used  . Tobacco comment: currently smokes .5ppd  Substance Use Topics  . Alcohol use: Not Currently    Alcohol/week: 5.0 standard drinks    Types: 2 Glasses of wine, 3 Cans of beer per week    Comment: daily  . Drug use: Yes    Comment: prescribed hydrocodone      Review of Systems  Constitutional: No fever/chills Cardiovascular: No chest pain. Respiratory: No cough. No SOB. Gastrointestinal: No abdominal pain.  No nausea, no vomiting.  Musculoskeletal: Positive for rib, hip, knee pain. Skin: Negative for rash, abrasions, lacerations, ecchymosis. Neurological: Negative for headaches. No numbness, tingling, weakness.   ____________________________________________   PHYSICAL EXAM:  VITAL SIGNS: ED Triage Vitals [12/19/18 1153]  Enc Vitals Group     BP (!) 146/70     Pulse Rate (!) 57     Resp 16     Temp 98.9 F (37.2 C)     Temp Source Oral     SpO2 97 %     Weight 215 lb (97.5 kg)     Height 5\' 11"  (1.803 m)     Head Circumference      Peak Flow      Pain Score 9     Pain Loc      Pain Edu?      Excl. in [place]?      Constitutional: Alert and oriented. Well appearing and in no acute distress. Eyes: Conjunctivae are normal. PERRL. EOMI. Head: Atraumatic. ENT:      Ears:      Nose: No congestion/rhinnorhea.      Mouth/Throat: Mucous membranes are moist.  Neck: No stridor. No cervical spine  tenderness to palpation. Cardiovascular: Normal rate, regular rhythm.  Good peripheral circulation. Respiratory: Normal respiratory effort without tachypnea or retractions. Lungs CTAB. Good air entry to the bases with no decreased or absent breath sounds. Gastrointestinal: Bowel sounds 4 quadrants. Soft and nontender to palpation. No guarding or rigidity. No palpable masses. No distention.  Musculoskeletal: Full range of motion to all extremities. No gross deformities appreciated.  Ambulatory without assistance.  Tenderness  to palpation to right mid buttocks.  Full range of motion of right hip.  Full range of motion of right knee.  No tenderness palpation to lumbar spine.  No tenderness palpation to trochanteric bursa.  No pinpoint tenderness to palpation to right anterior lateral rib cage but patient points to this area as overall soreness. Neurologic:  Normal speech and language. No gross focal neurologic deficits are appreciated.  Skin:  Skin is warm, dry and intact. No rash noted. Psychiatric: Mood and affect are normal. Speech and behavior are normal. Patient exhibits appropriate insight and judgement.   ____________________________________________   LABS (all labs ordered are listed, but only abnormal results are displayed)  Labs Reviewed - No data to display ____________________________________________  EKG  SB ____________________________________________  RADIOLOGY I, Laban Emperor, personally viewed and evaluated these images (plain radiographs) as part of my medical decision making, as well as reviewing the written report by the radiologist.  Dg Ribs Unilateral W/chest Right  Result Date: 12/19/2018 CLINICAL DATA:  [place] today. Chest and right rib pain. EXAM: RIGHT RIBS AND CHEST - 3+ VIEW COMPARISON:  07/19/2018 FINDINGS: The heart is upper limits of normal in size but stable. No acute pulmonary findings. No pleural effusion or pneumothorax. Dedicated views of the right ribs  demonstrates a suspected nondisplaced fifth anterolateral rib fracture. IMPRESSION: 1. No acute cardiopulmonary findings. 2. Suspect nondisplaced right fifth anterolateral rib fracture. 3. No pneumothorax or pleural effusion. Electronically Signed   By: Marijo Sanes M.D.   On: 12/19/2018 15:30   Dg Lumbar Spine 2-3 Views  Result Date: 12/19/2018 CLINICAL DATA:  [place] today. EXAM: LUMBAR SPINE - 2-3 VIEW COMPARISON:  [place] today. Back pain. FINDINGS: Normal alignment of the lumbar vertebral bodies. No acute fracture is identified. Advanced facet disease but no definite pars defects. The visualized bony pelvis is intact. Aortoiliac vascular calcifications without definite aneurysm. IMPRESSION: 1. Normal alignment and no acute bony findings. 2. Advanced facet disease. Electronically Signed   By: Marijo Sanes M.D.   On: 12/19/2018 15:31   Ct Head Wo Contrast  Result Date: 12/19/2018 CLINICAL DATA:  C-spine trauma. History of mechanical fall complaining of hip, knee, rib and neck pain. EXAM: CT HEAD WITHOUT CONTRAST CT CERVICAL SPINE WITHOUT CONTRAST TECHNIQUE: Multidetector CT imaging of the head and cervical spine was performed following the standard protocol without intravenous contrast. Multiplanar CT image reconstructions of the cervical spine were also generated. COMPARISON:  None FINDINGS: CT HEAD FINDINGS Brain: No evidence of acute infarction, hemorrhage, hydrocephalus, extra-axial collection or mass lesion/mass effect. Signs of atrophy and chronic microvascular ischemic change with remote infarct in the left occipital lobe. Vascular: No hyperdense vessel or unexpected calcification. Skull: Normal. Negative for fracture or focal lesion. Sinuses/Orbits: Right maxillary sinus disease with moderate mucoperiosteal thickening. No signs of hemosinus. Other: None. CT CERVICAL SPINE FINDINGS Alignment: Normal. Skull base and vertebrae: No acute fracture. No primary bone lesion or focal pathologic process. Soft  tissues and spinal canal: No prevertebral fluid or swelling. No visible canal hematoma. Disc levels: Multilevel degenerative changes greatest at C3-4, C4-5 and C5-6 with moderate size anterior osteophytes and uncovertebral degenerative changes. Facet hypertrophy worse at C3-4 on the left. Upper chest: Negative. Other: None IMPRESSION: 1. No acute intracranial abnormality. 2. Signs of atrophy and chronic microvascular ischemic change with remote left occipital lobe infarct. 3. No evidence for acute fracture or subluxation of the cervical spine. 4. Multilevel degenerative changes of the cervical spine. 5. Chronic right maxillary sinus disease. Electronically  Signed   By: Zetta Bills M.D.   On: 12/19/2018 13:38   Ct Cervical Spine Wo Contrast  Result Date: 12/19/2018 CLINICAL DATA:  C-spine trauma. History of mechanical fall complaining of hip, knee, rib and neck pain. EXAM: CT HEAD WITHOUT CONTRAST CT CERVICAL SPINE WITHOUT CONTRAST TECHNIQUE: Multidetector CT imaging of the head and cervical spine was performed following the standard protocol without intravenous contrast. Multiplanar CT image reconstructions of the cervical spine were also generated. COMPARISON:  None FINDINGS: CT HEAD FINDINGS Brain: No evidence of acute infarction, hemorrhage, hydrocephalus, extra-axial collection or mass lesion/mass effect. Signs of atrophy and chronic microvascular ischemic change with remote infarct in the left occipital lobe. Vascular: No hyperdense vessel or unexpected calcification. Skull: Normal. Negative for fracture or focal lesion. Sinuses/Orbits: Right maxillary sinus disease with moderate mucoperiosteal thickening. No signs of hemosinus. Other: None. CT CERVICAL SPINE FINDINGS Alignment: Normal. Skull base and vertebrae: No acute fracture. No primary bone lesion or focal pathologic process. Soft tissues and spinal canal: No prevertebral fluid or swelling. No visible canal hematoma. Disc levels: Multilevel  degenerative changes greatest at C3-4, C4-5 and C5-6 with moderate size anterior osteophytes and uncovertebral degenerative changes. Facet hypertrophy worse at C3-4 on the left. Upper chest: Negative. Other: None IMPRESSION: 1. No acute intracranial abnormality. 2. Signs of atrophy and chronic microvascular ischemic change with remote left occipital lobe infarct. 3. No evidence for acute fracture or subluxation of the cervical spine. 4. Multilevel degenerative changes of the cervical spine. 5. Chronic right maxillary sinus disease. Electronically Signed   By: Zetta Bills M.D.   On: 12/19/2018 13:38   Dg Knee Complete 4 Views Right  Result Date: 12/19/2018 CLINICAL DATA:  [place] today. Right knee pain. EXAM: RIGHT KNEE - COMPLETE 4+ VIEW COMPARISON:  None. FINDINGS: The joint spaces are maintained. No acute fracture or osteochondral lesion. No joint effusion. Vascular calcifications are noted. IMPRESSION: No acute bony findings or joint effusion. Electronically Signed   By: Marijo Sanes M.D.   On: 12/19/2018 15:25   Dg Hip Unilat W Or Wo Pelvis 2-3 Views Right  Result Date: 12/19/2018 CLINICAL DATA:  [place] today. Right hip pain. EXAM: DG HIP (WITH OR WITHOUT PELVIS) 2-3V RIGHT COMPARISON:  None. FINDINGS: Both hips are normally located. No acute hip fracture. The pubic symphysis and SI joints are intact. No definite pelvic fractures. IMPRESSION: No acute hip or pelvic fractures. Electronically Signed   By: Marijo Sanes M.D.   On: 12/19/2018 15:24    ____________________________________________    PROCEDURES  Procedure(s) performed:    Procedures    Medications  lidocaine (LIDODERM) 5 % 1 patch (1 patch Transdermal Patch Applied 12/19/18 1505)     ____________________________________________   INITIAL IMPRESSION / ASSESSMENT AND PLAN / ED COURSE  Pertinent labs & imaging results that were available during my care of the patient were reviewed by me and considered in my medical  decision making (see chart for details).  Review of the [place] CSRS was performed in accordance of the [place] prior to dispensing any controlled drugs.  Patient presented to the emergency department for evaluation after fall 4 days ago.  Vital signs and exam are reassuring.  Patient is well-appearing and ambulating around the emergency department with the use of his cane.  Head CT, cervical CT, right rib and chest x-ray, lumbar x-ray, hip x-ray, knee x-ray are negative for acute abnormalities. EKG shows SB. On chart review, HR was as low as 48 at office visit  in June. No headache, dizziness, SOB, CP.   Lidoderm patch was placed to right hip for pain.  Patient was given a walker.  Patient will be discharged home with prescriptions for Lidoderm. Patient is to follow up with primary care as directed. Patient is given ED precautions to return to the ED for any worsening or new symptoms.  DIETRICH MARAN Bell was evaluated in Emergency Department on 12/19/2018 for the symptoms described in the history of present illness. He was evaluated in the context of the global COVID-19 pandemic, which necessitated consideration that the patient might be at risk for infection with the SARS-CoV-2 virus that causes COVID-19. Institutional protocols and algorithms that pertain to the evaluation of patients at risk for COVID-19 are in a state of rapid change based on information released by regulatory bodies including the CDC and federal and state organizations. These policies and algorithms were followed during the patient's care in the ED.   ____________________________________________  FINAL CLINICAL IMPRESSION(S) / ED DIAGNOSES  Final diagnoses:  Closed fracture of one rib of right side, initial encounter  Hip pain  Fall, initial encounter      NEW MEDICATIONS STARTED DURING THIS VISIT:  ED Discharge Orders         Ordered    lidocaine (LIDODERM) 5 %  Every 24 hours     12/19/18 1538              This  chart was dictated using voice recognition software/Dragon. Despite best efforts to proofread, errors can occur which can change the meaning. Any change was purely unintentional.    Laban Emperor, PA-C 12/19/18 1553    Nena Polio, MD 12/19/18 [phone]

## 2018-12-19 NOTE — ED Triage Notes (Signed)
Patient had mechanical fall today going into [employer]. C/o right hip, ribs and leg pain. Denies hitting head or LOC.

## 2018-12-22 DIAGNOSIS — M4726 Other spondylosis with radiculopathy, lumbar region: Secondary | ICD-10-CM | POA: Insufficient documentation

## 2018-12-22 DIAGNOSIS — G8929 Other chronic pain: Secondary | ICD-10-CM | POA: Insufficient documentation

## 2018-12-22 DIAGNOSIS — R600 Localized edema: Secondary | ICD-10-CM | POA: Insufficient documentation

## 2018-12-23 ENCOUNTER — Telehealth: Payer: Self-pay

## 2018-12-23 NOTE — Telephone Encounter (Signed)
Left voicemail for pt to inform him that it is time for his annual lung cancer screening. Instructed pt to return call and confirm information prior to CT scan being scheduled.

## 2018-12-24 ENCOUNTER — Telehealth: Payer: Self-pay | Admitting: *Deleted

## 2018-12-24 DIAGNOSIS — Z87891 Personal history of nicotine dependence: Secondary | ICD-10-CM

## 2018-12-24 NOTE — Telephone Encounter (Signed)
Patient has been notified that annual lung cancer screening low dose CT scan is due currently or will be in near future. Confirmed that patient is within the age range of 55-77, and asymptomatic, (no signs or symptoms of lung cancer). Patient denies illness that would prevent curative treatment for lung cancer if found. Verified smoking history, (current, 57.5 pack year). The shared decision making visit was done 12/31/17. Patient is agreeable for CT scan being scheduled.

## 2018-12-30 ENCOUNTER — Other Ambulatory Visit: Payer: Self-pay | Admitting: Internal Medicine

## 2018-12-30 DIAGNOSIS — M25551 Pain in right hip: Secondary | ICD-10-CM

## 2018-12-30 DIAGNOSIS — Z9181 History of falling: Secondary | ICD-10-CM

## 2018-12-31 ENCOUNTER — Ambulatory Visit
Admission: RE | Admit: 2018-12-31 | Discharge: 2018-12-31 | Disposition: A | Discharge status: Home / Self Care | Payer: Medicare Other | Source: Ambulatory Visit | Attending: Internal Medicine | Admitting: Internal Medicine

## 2018-12-31 ENCOUNTER — Other Ambulatory Visit: Payer: Self-pay

## 2018-12-31 DIAGNOSIS — M25551 Pain in right hip: Secondary | ICD-10-CM | POA: Diagnosis present

## 2018-12-31 DIAGNOSIS — Z9181 History of falling: Secondary | ICD-10-CM | POA: Diagnosis present

## 2018-12-31 LAB — POCT I-STAT CREATININE: Creatinine, Ser: 1.7 mg/dL — ABNORMAL HIGH (ref 0.61–1.24)

## 2018-12-31 MED ORDER — GADOBUTROL 1 MMOL/ML IV SOLN
7.5000 mL | Freq: Once | INTRAVENOUS | Status: AC | PRN
  Administered 2018-12-31: 7.5 mL via INTRAVENOUS

## 2018-12-31 MED ORDER — GADOBUTROL 1 MMOL/ML IV SOLN: 9.0000 mL | Freq: Once | INTRAVENOUS | Status: DC | PRN

## 2019-01-03 ENCOUNTER — Ambulatory Visit: Payer: Medicare Other

## 2019-01-03 ENCOUNTER — Ambulatory Visit: Admission: RE | Admit: 2019-01-03 | Payer: Medicare Other | Source: Ambulatory Visit

## 2019-02-05 DIAGNOSIS — D72829 Elevated white blood cell count, unspecified: Secondary | ICD-10-CM | POA: Insufficient documentation

## 2019-02-05 NOTE — Progress Notes (Signed)
[name]  Telephone:(336) [phone] Fax:(336) [phone]  ID: Jeremiah Bell OB: [birth year]  MR#: [id number]  TDV#:[id number]  Patient Care Team: Jeremiah Harrier, MD as PCP - General (Internal Medicine)  CHIEF COMPLAINT: Leukocytosis  INTERVAL HISTORY: Patient is a 79 year old male who has had a persistently elevated white blood cell count for several years, but recently it has trended up.  He has a chronic peripheral neuropathy, but otherwise feels well and at his baseline.  He has no other neurologic complaints.  He denies any recent fevers or illnesses.  He has no new medications.  He denies any chest pain, shortness of breath, cough, or hemoptysis.  He has no nausea, vomiting, constipation, or diarrhea.  He has no urinary complaints.  Patient otherwise feels well and offers no further specific complaints today.  REVIEW OF SYSTEMS:   Review of Systems  Constitutional: Negative.  Negative for fever, malaise/fatigue and weight loss.  Respiratory: Negative.  Negative for cough and shortness of breath.   Cardiovascular: Negative.  Negative for chest pain and leg swelling.  Gastrointestinal: Negative.  Negative for abdominal pain.  Genitourinary: Negative.  Negative for dysuria.  Musculoskeletal: Negative.  Negative for back pain.  Skin: Negative.  Negative for rash.  Neurological: Positive for tingling and sensory change. Negative for dizziness, focal weakness, weakness and headaches.  Psychiatric/Behavioral: Negative.  The patient is not nervous/anxious.     As per HPI. Otherwise, a complete review of systems is negative.  PAST MEDICAL HISTORY: Past Medical History:  Diagnosis Date  . A-fib ([place])   . Anxiety   . BPH (benign prostatic hyperplasia)   . GERD (gastroesophageal reflux disease)   . Hx of basal cell carcinoma 01/22/2015   L crown of scalp  . Hx of squamous cell carcinoma of skin 01/28/2016   L distal pretibial, R medial cheek  . Hyperlipidemia    . Hypertension   . Irritable bowel     PAST SURGICAL HISTORY: Past Surgical History:  Procedure Laterality Date  . BASAL CELL CARCINOMA EXCISION Left 01/22/2015   crown of scalp  . COLONOSCOPY WITH PROPOFOL N/A 01/26/2018   Procedure: COLONOSCOPY WITH PROPOFOL;  Surgeon: Jeremiah Bell, Jeremiah Pike, MD;  Location: [facility] ENDOSCOPY;  Service: Gastroenterology;  Laterality: N/A;    FAMILY HISTORY: Family History  Problem Relation Age of Onset  . Suicidality Mother   . Vascular Disease Mother   . Alzheimer's disease Father   . Suicidality Paternal Grandfather     ADVANCED DIRECTIVES (Y/N):  N  HEALTH MAINTENANCE: Social History   Tobacco Use  . Smoking status: Current Every Day Smoker    Packs/day: 1.00    Years: 57.00    Pack years: 57.00    Types: Cigarettes  . Smokeless tobacco: Never Used  . Tobacco comment: currently smokes .5ppd  Substance Use Topics  . Alcohol use: Not Currently    Alcohol/week: 5.0 standard drinks    Types: 2 Glasses of wine, 3 Cans of beer per week    Comment: daily  . Drug use: Yes    Comment: prescribed hydrocodone      Colonoscopy:  PAP:  Bone density:  Lipid panel:  Allergies  Allergen Reactions  . Erythromycin   . Terramycin [Oxytetracycline] Other (See Comments)  . Sulfa Antibiotics Other (See Comments)    Pt does not remember    Current Outpatient Medications  Medication Sig Dispense Refill  . ALPRAZolam (XANAX) 1 MG tablet Take 1 mg by mouth 2 (two) times  daily as needed for anxiety.    . cephALEXin (KEFLEX) 500 MG capsule Take 500 mg by mouth 4 (four) times daily.    . DULoxetine (CYMBALTA) 20 MG capsule Start with 20 my daily for one month then increase to 30 mg daily    . HYDROcodone-acetaminophen (NORCO/VICODIN) 5-325 MG tablet Take 1 tablet by mouth every 6 (six) hours as needed for moderate pain. 12 tablet 0  . omeprazole (PRILOSEC) 40 MG capsule Take 40 mg by mouth daily.  0  . oxyCODONE-acetaminophen (PERCOCET) 7.5-325 MG  tablet Take by mouth.    . senna-docusate (SENOKOT-S) 8.6-50 MG tablet Take 1 tablet by mouth 2 (two) times daily.    . simvastatin (ZOCOR) 40 MG tablet Take 40 mg by mouth daily.    . tamsulosin (FLOMAX) 0.4 MG CAPS capsule Take 0.4 mg by mouth 2 (two) times daily.  0  . torsemide (DEMADEX) 10 MG tablet Take 30 mg by mouth daily.    . traZODone (DESYREL) 50 MG tablet Take 50 mg by mouth at bedtime.    . vitamin B-12 (CYANOCOBALAMIN) 1000 MCG tablet Take 1,000 mcg by mouth daily.    Marland Kitchen warfarin (COUMADIN) 1 MG tablet Take 1 mg by mouth daily.    Marland Kitchen warfarin (COUMADIN) 2 MG tablet Take 2 mg by mouth 2 (two) times a week.    . warfarin (COUMADIN) 5 MG tablet Take 5 mg by mouth daily.    Marland Kitchen dicyclomine (BENTYL) 10 MG capsule Take 10 mg by mouth 4 (four) times daily as needed.  0  . flecainide (TAMBOCOR) 50 MG tablet Take 50 mg by mouth 2 (two) times daily.  2  . lidocaine (LIDODERM) 5 % Place 1 patch onto the skin daily. Remove & Discard patch within 12 hours or as directed by MD (Patient not taking: Reported on 02/07/2019) 30 patch 0  . metoprolol succinate (TOPROL-XL) 50 MG 24 hr tablet Take by mouth.    . mirtazapine (REMERON) 15 MG tablet mirtazapine 15 mg tablet  TAKE 1 TABLET BY MOUTH EVERY DAY AT NIGHT    . predniSONE (DELTASONE) 20 MG tablet Take 2 tablets (40 mg total) by mouth daily. (Patient not taking: Reported on 02/07/2019) 8 tablet 0   No current facility-administered medications for this visit.    OBJECTIVE: Vitals:   02/07/19 1123  BP: 120/90  Pulse: 63  Resp: 20  Temp: 97.9 F (36.6 C)  SpO2: 97%     Body mass index is 28.31 kg/m.    ECOG FS:0 - Asymptomatic  General: Well-developed, well-nourished, no acute distress. Eyes: Pink conjunctiva, anicteric sclera. HEENT: Normocephalic, moist mucous membranes. Lungs: No audible wheezing or coughing. Heart: Regular rate and rhythm. Abdomen: Soft, nontender, no obvious distention. Musculoskeletal: No edema, cyanosis, or  clubbing. Neuro: Alert, answering all questions appropriately. Cranial nerves grossly intact. Skin: No rashes or petechiae noted. Psych: Normal affect. Lymphatics: No cervical, calvicular, axillary or inguinal LAD.   LAB RESULTS:  Lab Results  Component Value Date   NA 142 07/19/2018   K 4.7 07/19/2018   CL 108 07/19/2018   CO2 26 07/19/2018   GLUCOSE 101 (H) 07/19/2018   BUN 20 07/19/2018   CREATININE 1.70 (H) 12/31/2018   CALCIUM 8.8 (L) 07/19/2018   PROT 7.2 03/16/2018   ALBUMIN 4.3 03/16/2018   AST 25 03/16/2018   ALT 22 03/16/2018   ALKPHOS 44 03/16/2018   BILITOT 0.7 03/16/2018   GFRNONAA 59 (L) 07/19/2018   GFRAA >60 07/19/2018  Lab Results  Component Value Date   WBC 11.0 (H) 02/07/2019   NEUTROABS 8.3 (H) 02/07/2019   HGB 15.5 02/07/2019   HCT 47.5 02/07/2019   MCV 95.0 02/07/2019   PLT 231 02/07/2019   Lab Results  Component Value Date   IRON 86 02/07/2019   TIBC 337 02/07/2019   IRONPCTSAT 26 02/07/2019   Lab Results  Component Value Date   FERRITIN 35 02/07/2019     STUDIES: No results found.  ASSESSMENT: Leukocytosis  PLAN:    1. Leukocytosis: Upon review of the chart, patient's white blood cell count has been mildly elevated since at least April 2018.  Today's result is 11.0 which is approximately his baseline.  Have ordered peripheral blood flow cytometry and BCR-ABL mutation for completeness.  These are pending at time of dictation.  No intervention is needed at this time.  Patient does not require a bone marrow biopsy.  Return to clinic in 3 weeks for further evaluation and discussion of his laboratory results. 2.  Peripheral neuropathy: Continue follow-up with neurology as indicated. 3.  Renal insufficiency: Patient's most recent creatinine was reported at 1.7.  I spent a total of 45 minutes reviewing chart data, face-to-face evaluation with the patient, counseling and coordination of care as detailed above.   Patient expressed  understanding and was in agreement with this plan. He also understands that He can call clinic at any time with any questions, concerns, or complaints.    Lloyd Huger, MD   02/07/2019 3:16 PM

## 2019-02-07 ENCOUNTER — Inpatient Hospital Stay: Payer: Medicare Other | Attending: Oncology | Admitting: Oncology

## 2019-02-07 ENCOUNTER — Other Ambulatory Visit: Payer: Self-pay

## 2019-02-07 ENCOUNTER — Inpatient Hospital Stay: Payer: Medicare Other

## 2019-02-07 DIAGNOSIS — Z7952 Long term (current) use of systemic steroids: Secondary | ICD-10-CM | POA: Diagnosis not present

## 2019-02-07 DIAGNOSIS — K219 Gastro-esophageal reflux disease without esophagitis: Secondary | ICD-10-CM

## 2019-02-07 DIAGNOSIS — F419 Anxiety disorder, unspecified: Secondary | ICD-10-CM

## 2019-02-07 DIAGNOSIS — I4891 Unspecified atrial fibrillation: Secondary | ICD-10-CM | POA: Diagnosis not present

## 2019-02-07 DIAGNOSIS — Z85828 Personal history of other malignant neoplasm of skin: Secondary | ICD-10-CM | POA: Diagnosis not present

## 2019-02-07 DIAGNOSIS — G629 Polyneuropathy, unspecified: Secondary | ICD-10-CM | POA: Diagnosis not present

## 2019-02-07 DIAGNOSIS — Z7901 Long term (current) use of anticoagulants: Secondary | ICD-10-CM | POA: Diagnosis not present

## 2019-02-07 DIAGNOSIS — N289 Disorder of kidney and ureter, unspecified: Secondary | ICD-10-CM

## 2019-02-07 DIAGNOSIS — E785 Hyperlipidemia, unspecified: Secondary | ICD-10-CM | POA: Diagnosis not present

## 2019-02-07 DIAGNOSIS — Z79899 Other long term (current) drug therapy: Secondary | ICD-10-CM | POA: Insufficient documentation

## 2019-02-07 DIAGNOSIS — I10 Essential (primary) hypertension: Secondary | ICD-10-CM | POA: Diagnosis not present

## 2019-02-07 DIAGNOSIS — D72829 Elevated white blood cell count, unspecified: Secondary | ICD-10-CM | POA: Diagnosis present

## 2019-02-07 DIAGNOSIS — F17210 Nicotine dependence, cigarettes, uncomplicated: Secondary | ICD-10-CM | POA: Diagnosis not present

## 2019-02-07 LAB — CBC WITH DIFFERENTIAL/PLATELET
Abs Immature Granulocytes: 0.06 10*3/uL (ref 0.00–0.07)
Basophils Absolute: 0 10*3/uL (ref 0.0–0.1)
Basophils Relative: 0 %
Eosinophils Absolute: 0.1 10*3/uL (ref 0.0–0.5)
Eosinophils Relative: 1 %
HCT: 47.5 % (ref 39.0–52.0)
Hemoglobin: 15.5 g/dL (ref 13.0–17.0)
Immature Granulocytes: 1 %
Lymphocytes Relative: 16 %
Lymphs Abs: 1.7 10*3/uL (ref 0.7–4.0)
MCH: 31 pg (ref 26.0–34.0)
MCHC: 32.6 g/dL (ref 30.0–36.0)
MCV: 95 fL (ref 80.0–100.0)
Monocytes Absolute: 0.8 10*3/uL (ref 0.1–1.0)
Monocytes Relative: 7 %
Neutro Abs: 8.3 10*3/uL — ABNORMAL HIGH (ref 1.7–7.7)
Neutrophils Relative %: 75 %
Platelets: 231 10*3/uL (ref 150–400)
RBC: 5 MIL/uL (ref 4.22–5.81)
RDW: 13.2 % (ref 11.5–15.5)
WBC: 11 10*3/uL — ABNORMAL HIGH (ref 4.0–10.5)
nRBC: 0 % (ref 0.0–0.2)

## 2019-02-07 LAB — IRON AND TIBC
Iron: 86 ug/dL (ref 45–182)
Saturation Ratios: 26 % (ref 17.9–39.5)
TIBC: 337 ug/dL (ref 250–450)
UIBC: 251 ug/dL

## 2019-02-07 LAB — FERRITIN: Ferritin: 35 ng/mL (ref 24–336)

## 2019-02-07 LAB — LACTATE DEHYDROGENASE: LDH: 138 U/L (ref 98–192)

## 2019-02-10 LAB — COMP PANEL: LEUKEMIA/LYMPHOMA

## 2019-02-11 LAB — BCR-ABL1, CML/ALL, PCR, QUANT: Interpretation (BCRAL):: NEGATIVE

## 2019-02-26 NOTE — Progress Notes (Signed)
[place]  [phone] Fax:(336) [phone]  ID: Jeremiah Bell OB: Jul 31, 1940  MR#: [id number]  UQJ#:[id number]  Patient Care Team: Tracie Harrier, MD as PCP - General (Internal Medicine)  I connected with Jeremiah Bell on 03/08/2019 at 10:45 AM EST by video enabled telemedicine visit and verified that I am speaking with the correct person using two identifiers.   I discussed the limitations, risks, security and privacy concerns of performing an evaluation and management service by telemedicine and the availability of in-person appointments. I also discussed with the patient that there may be a patient responsible charge related to this service. The patient expressed understanding and agreed to proceed.   Other persons participating in the visit and their role in the encounter: Patient, MD.  Patient's location: Home. Provider's location: Clinic.  CHIEF COMPLAINT: Leukocytosis  INTERVAL HISTORY: Patient agreed to video enabled telemedicine visit for further evaluation and discussion of his laboratory work.  He currently feels well and is at his baseline.  He has a chronic peripheral neuropathy, but no other neurologic complaints.  He denies any recent fevers or illnesses. He denies any chest pain, shortness of breath, cough, or hemoptysis.  He has no nausea, vomiting, constipation, or diarrhea.  He has no urinary complaints.  Patient offers no specific complaints today.  REVIEW OF SYSTEMS:   Review of Systems  Constitutional: Negative.  Negative for fever, malaise/fatigue and weight loss.  Respiratory: Negative.  Negative for cough and shortness of breath.   Cardiovascular: Negative.  Negative for chest pain and leg swelling.  Gastrointestinal: Negative.  Negative for abdominal pain.  Genitourinary: Negative.  Negative for dysuria.  Musculoskeletal: Negative.  Negative for back pain.  Skin: Negative.  Negative for rash.  Neurological: Positive  for tingling and sensory change. Negative for dizziness, focal weakness, weakness and headaches.  Psychiatric/Behavioral: Negative.  The patient is not nervous/anxious.     As per HPI. Otherwise, a complete review of systems is negative.  PAST MEDICAL HISTORY: Past Medical History:  Diagnosis Date  . A-fib ([place])   . Anxiety   . BPH (benign prostatic hyperplasia)   . GERD (gastroesophageal reflux disease)   . Hx of basal cell carcinoma 01/22/2015   L crown of scalp  . Hx of squamous cell carcinoma of skin 01/28/2016   L distal pretibial, R medial cheek  . Hyperlipidemia   . Hypertension   . Irritable bowel     PAST SURGICAL HISTORY: Past Surgical History:  Procedure Laterality Date  . BASAL CELL CARCINOMA EXCISION Left 01/22/2015   crown of scalp  . COLONOSCOPY WITH PROPOFOL N/A 01/26/2018   Procedure: COLONOSCOPY WITH PROPOFOL;  Surgeon: [place], Benay Pike, MD;  Location: [facility] ENDOSCOPY;  Service: Gastroenterology;  Laterality: N/A;    FAMILY HISTORY: Family History  Problem Relation Age of Onset  . Suicidality Mother   . Vascular Disease Mother   . Alzheimer's disease Father   . Suicidality Paternal Grandfather     ADVANCED DIRECTIVES (Y/N):  N  HEALTH MAINTENANCE: Social History   Tobacco Use  . Smoking status: Current Every Day Smoker    Packs/day: 1.00    Years: 57.00    Pack years: 57.00    Types: Cigarettes  . Smokeless tobacco: Never Used  . Tobacco comment: currently smokes .5ppd  Substance Use Topics  . Alcohol use: Not Currently    Alcohol/week: 5.0 standard drinks    Types: 2 Glasses of wine, 3 Cans of beer per  week    Comment: daily  . Drug use: Yes    Comment: prescribed hydrocodone      Colonoscopy:  PAP:  Bone density:  Lipid panel:  Allergies  Allergen Reactions  . Erythromycin   . Terramycin [Oxytetracycline] Other (See Comments)  . Sulfa Antibiotics Other (See Comments)    Pt does not remember    Current Outpatient Medications   Medication Sig Dispense Refill  . ALPRAZolam (XANAX) 1 MG tablet Take 1 mg by mouth 2 (two) times daily as needed for anxiety.    . DULoxetine (CYMBALTA) 20 MG capsule Start with 20 my daily for one month then increase to 30 mg daily    . flecainide (TAMBOCOR) 50 MG tablet Take 50 mg by mouth 2 (two) times daily.  2  . lidocaine (LIDODERM) 5 % Place 1 patch onto the skin daily. Remove & Discard patch within 12 hours or as directed by MD 30 patch 0  . metoprolol succinate (TOPROL-XL) 50 MG 24 hr tablet Take by mouth.    . mirtazapine (REMERON) 15 MG tablet mirtazapine 15 mg tablet  TAKE 1 TABLET BY MOUTH EVERY DAY AT NIGHT    . omeprazole (PRILOSEC) 40 MG capsule Take 40 mg by mouth daily.  0  . oxyCODONE-acetaminophen (PERCOCET) 7.5-325 MG tablet Take 1 tablet by mouth 2 (two) times daily as needed.    . senna-docusate (SENOKOT-S) 8.6-50 MG tablet Take 1 tablet by mouth 2 (two) times daily.    . simvastatin (ZOCOR) 40 MG tablet Take 40 mg by mouth daily.    . tamsulosin (FLOMAX) 0.4 MG CAPS capsule Take 0.4 mg by mouth 2 (two) times daily.  0  . torsemide (DEMADEX) 10 MG tablet Take 30 mg by mouth daily.    . traZODone (DESYREL) 50 MG tablet Take 50 mg by mouth at bedtime.    . vitamin B-12 (CYANOCOBALAMIN) 1000 MCG tablet Take 1,000 mcg by mouth daily.    Marland Kitchen warfarin (COUMADIN) 1 MG tablet Take 1 mg by mouth 2 (two) times a week. Saturday, Sunday    . warfarin (COUMADIN) 5 MG tablet Take 5 mg by mouth daily. Monday, Tuesday, Wednesday, Thursday, Friday    . dicyclomine (BENTYL) 10 MG capsule Take 10 mg by mouth 4 (four) times daily as needed.  0  . HYDROcodone-acetaminophen (NORCO/VICODIN) 5-325 MG tablet Take 1 tablet by mouth every 6 (six) hours as needed for moderate pain. (Patient not taking: Reported on 03/02/2019) 12 tablet 0  . predniSONE (DELTASONE) 20 MG tablet Take 2 tablets (40 mg total) by mouth daily. (Patient not taking: Reported on 02/07/2019) 8 tablet 0   No current  facility-administered medications for this visit.    OBJECTIVE: There were no vitals filed for this visit.   There is no height or weight on file to calculate BMI.    ECOG FS:0 - Asymptomatic  General: Well-developed, well-nourished, no acute distress. HEENT: Normocephalic. Neuro: Alert, answering all questions appropriately. Cranial nerves grossly intact. Psych: Normal affect.  LAB RESULTS:  Lab Results  Component Value Date   NA 142 07/19/2018   K 4.7 07/19/2018   CL 108 07/19/2018   CO2 26 07/19/2018   GLUCOSE 101 (H) 07/19/2018   BUN 20 07/19/2018   CREATININE 1.70 (H) 12/31/2018   CALCIUM 8.8 (L) 07/19/2018   PROT 7.2 03/16/2018   ALBUMIN 4.3 03/16/2018   AST 25 03/16/2018   ALT 22 03/16/2018   ALKPHOS 44 03/16/2018   BILITOT 0.7 03/16/2018  GFRNONAA 59 (L) 07/19/2018   GFRAA >60 07/19/2018    Lab Results  Component Value Date   WBC 11.0 (H) 02/07/2019   NEUTROABS 8.3 (H) 02/07/2019   HGB 15.5 02/07/2019   HCT 47.5 02/07/2019   MCV 95.0 02/07/2019   PLT 231 02/07/2019   Lab Results  Component Value Date   IRON 86 02/07/2019   TIBC 337 02/07/2019   IRONPCTSAT 26 02/07/2019   Lab Results  Component Value Date   FERRITIN 35 02/07/2019     STUDIES: No results found.  ASSESSMENT: Leukocytosis  PLAN:    1. Leukocytosis: Upon review of the chart, patient's white blood cell count has been mildly elevated since at least April 2018.  His most recent result is 11.0 which is essentially his baseline.  Peripheral blood flow cytometry and BCR-ABL mutation are negative.  No intervention is needed at this time.  Patient does not require bone marrow biopsy.  After lengthy discussion with the patient, it was agreed upon that no further follow-up is necessary.  Please refer patient back if there are any questions or concerns.  2.  Peripheral neuropathy: Continue follow-up with neurology as indicated. 3.  Renal insufficiency: Patient's most recent creatinine was  reported at 1.7.  I provided 20 minutes of face-to-face video visit time during this encounter which included chart review, counseling, and coordination of care as documented above.  Patient expressed understanding and was in agreement with this plan. He also understands that He can call clinic at any time with any questions, concerns, or complaints.    Lloyd Huger, MD   02/25/2019 12:04 PM

## 2019-03-01 ENCOUNTER — Ambulatory Visit: Payer: Medicare Other | Admitting: Oncology

## 2019-03-02 ENCOUNTER — Encounter: Payer: Self-pay | Admitting: Oncology

## 2019-03-02 ENCOUNTER — Other Ambulatory Visit: Payer: Self-pay

## 2019-03-02 DIAGNOSIS — K219 Gastro-esophageal reflux disease without esophagitis: Secondary | ICD-10-CM | POA: Insufficient documentation

## 2019-03-02 DIAGNOSIS — N400 Enlarged prostate without lower urinary tract symptoms: Secondary | ICD-10-CM | POA: Insufficient documentation

## 2019-03-02 DIAGNOSIS — F419 Anxiety disorder, unspecified: Secondary | ICD-10-CM | POA: Insufficient documentation

## 2019-03-02 NOTE — Progress Notes (Signed)
Patient stated that he overall been doing well, except that his feet are aching and painful. Therefore, his PCP gave him Oxycodone.

## 2019-03-03 ENCOUNTER — Inpatient Hospital Stay: Payer: Medicare Other | Attending: Oncology | Admitting: Oncology

## 2019-03-03 DIAGNOSIS — E785 Hyperlipidemia, unspecified: Secondary | ICD-10-CM

## 2019-03-03 DIAGNOSIS — F419 Anxiety disorder, unspecified: Secondary | ICD-10-CM

## 2019-03-03 DIAGNOSIS — Z79899 Other long term (current) drug therapy: Secondary | ICD-10-CM

## 2019-03-03 DIAGNOSIS — D72829 Elevated white blood cell count, unspecified: Secondary | ICD-10-CM | POA: Diagnosis not present

## 2019-03-03 DIAGNOSIS — N289 Disorder of kidney and ureter, unspecified: Secondary | ICD-10-CM

## 2019-03-03 DIAGNOSIS — F17210 Nicotine dependence, cigarettes, uncomplicated: Secondary | ICD-10-CM

## 2019-03-03 DIAGNOSIS — G629 Polyneuropathy, unspecified: Secondary | ICD-10-CM | POA: Diagnosis not present

## 2019-03-03 DIAGNOSIS — I10 Essential (primary) hypertension: Secondary | ICD-10-CM

## 2019-03-03 DIAGNOSIS — K219 Gastro-esophageal reflux disease without esophagitis: Secondary | ICD-10-CM

## 2019-03-14 DEATH — deceased

## 2019-03-30 ENCOUNTER — Ambulatory Visit: Payer: Medicare Other | Admitting: Dermatology

## 2020-01-20 IMAGING — MR MRI OF THE RIGHT HIP WITHOUT CONTRAST
5 series · 37 of 40 positions shown · non-contrast
Comparison: CT scan of the abdomen dated 02/01/2018

CLINICAL DATA: Chronic progressive right hip pain beginning with a
fall 3 months ago.

EXAM:
MR OF THE RIGHT HIP WITHOUT CONTRAST
TECHNIQUE: Multiplanar, multisequence MR imaging was performed. No intravenous
contrast was administered.

[Series 9: STIR · coronal · right · 4.0mm · 0.85mm/px · 7 of 40 slices shown]
[im 1/40]
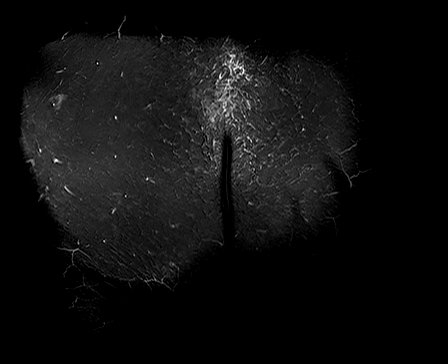
[im 5/40]
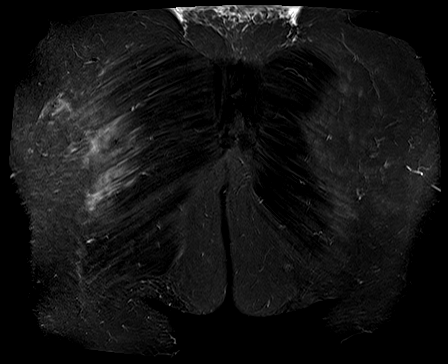
[im 14/40]
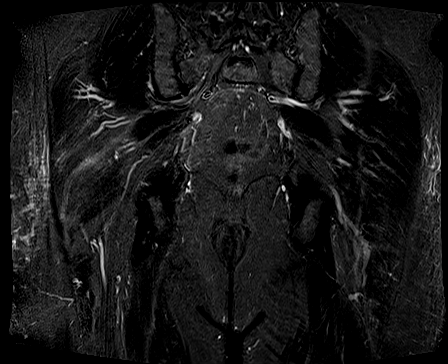
[im 18/40]
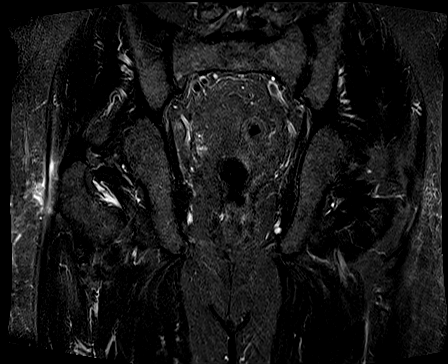
[im 22/40]
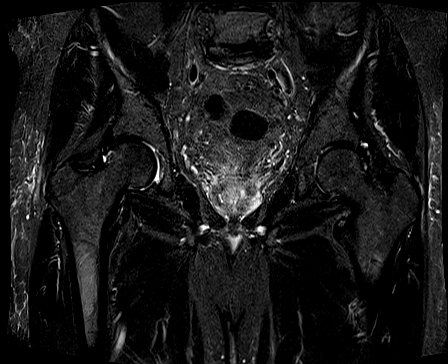
[im 27/40]
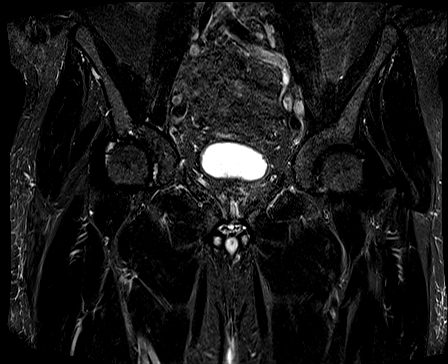
[im 35/40]
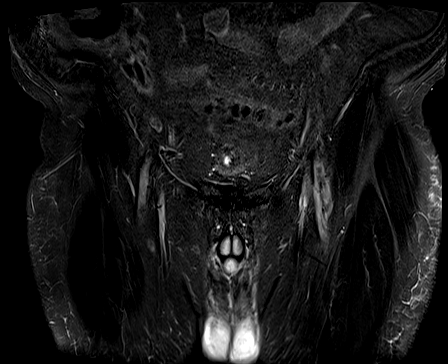

[Series 10: T2 fat-sat · axial · right · 4.0mm · 0.35mm/px · z∈[-81,+64]mm · 7 of 30 slices shown]
[im 1/30]
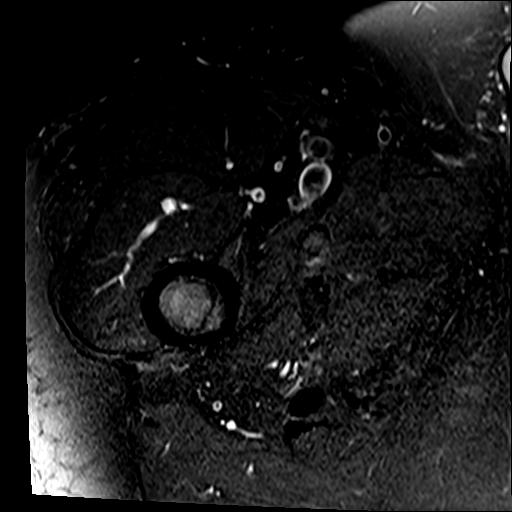
[im 5/30]
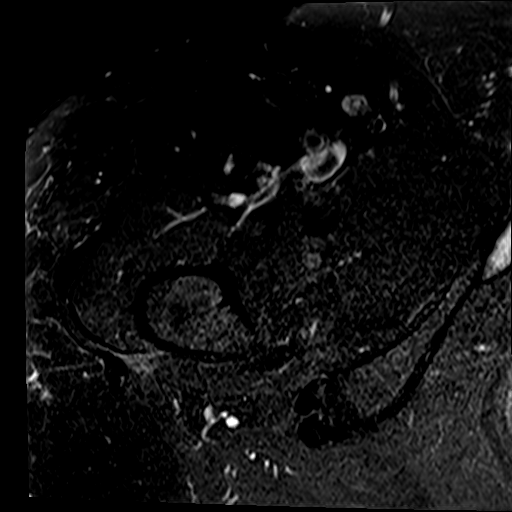
[im 10/30]
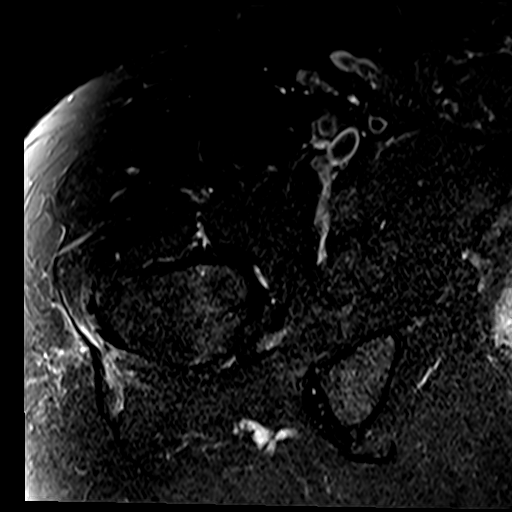
[im 15/30]
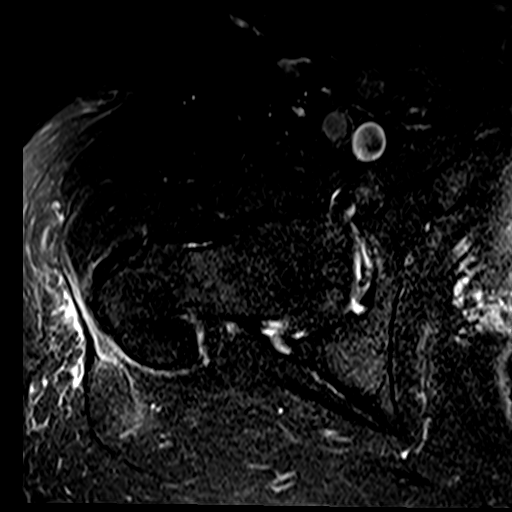
[im 20/30]
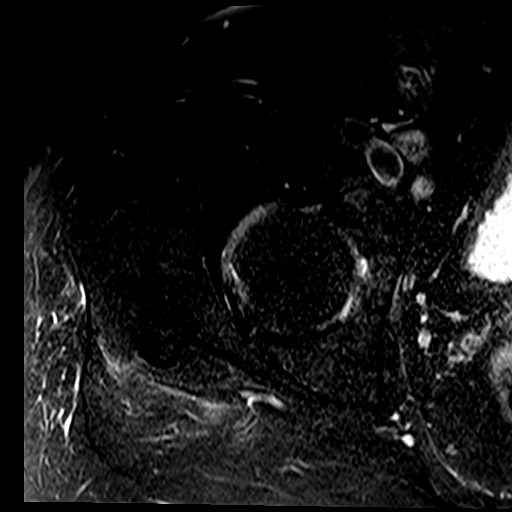
[im 25/30]
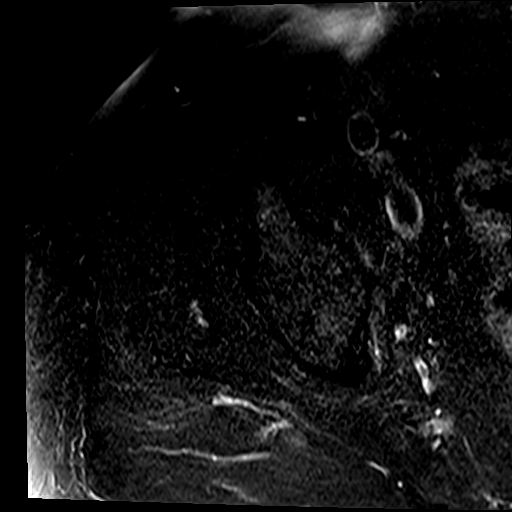
[im 30/30]
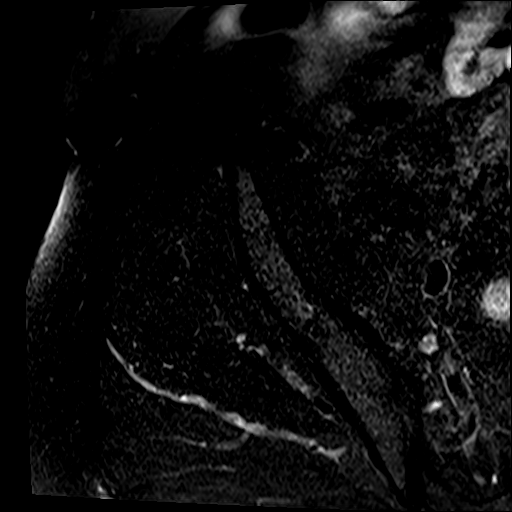

[Series 11: PD fat-sat · sagittal · right · 4.0mm · 0.78mm/px · 7 of 31 slices shown (1 of 2)]
[im 1/31]
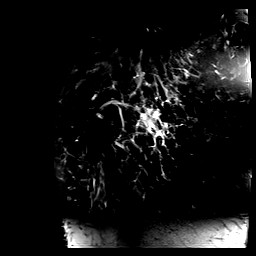
[im 6/31]
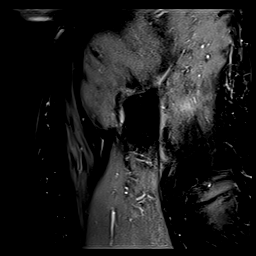
[im 11/31]
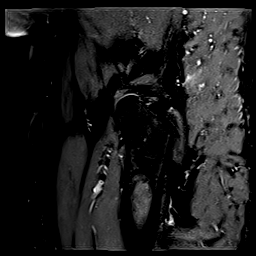
[im 16/31]
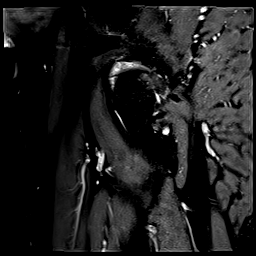
[im 21/31]
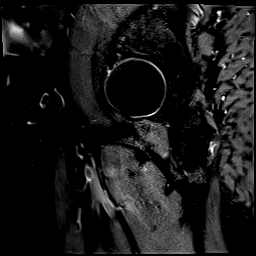
[im 26/31]
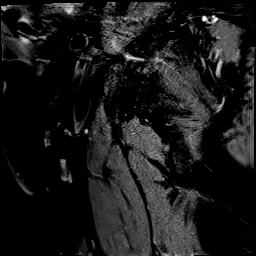
[im 31/31]
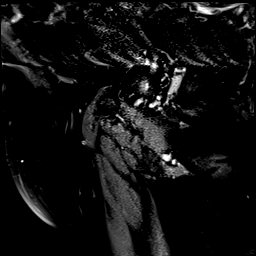

[Series 12: PD fat-sat · coronal · right · 4.0mm · 0.70mm/px · 7 of 29 slices shown (2 of 2)]
[im 1/29]
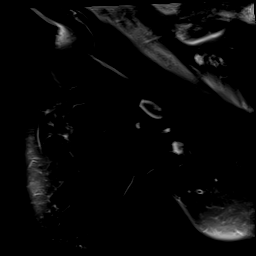
[im 5/29]
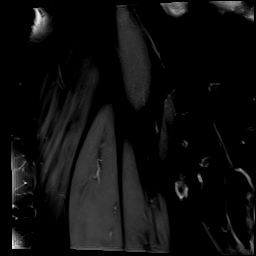
[im 10/29]
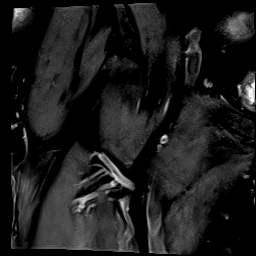
[im 15/29]
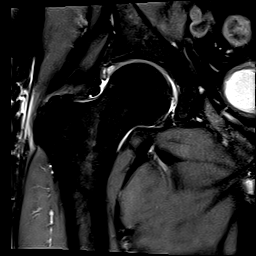
[im 19/29]
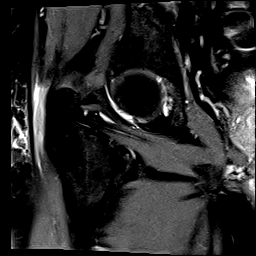
[im 24/29]
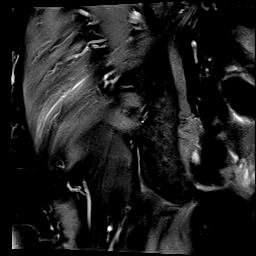
[im 29/29]
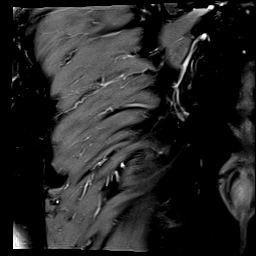

[Series 13: T1 · coronal · right · 4.0mm · 1.19mm/px · 9 of 40 slices shown]
[im 1/40]
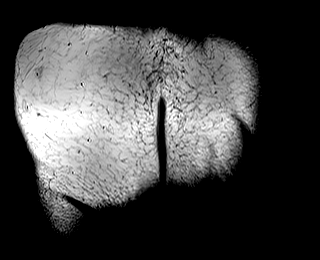
[im 5/40]
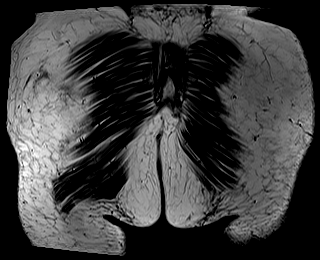
[im 10/40]
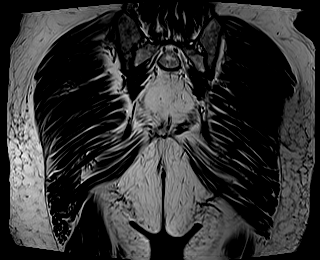
[im 15/40]
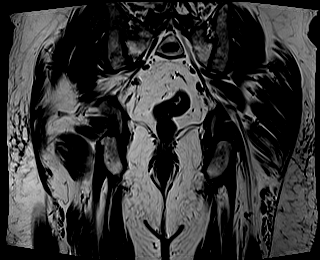
[im 20/40]
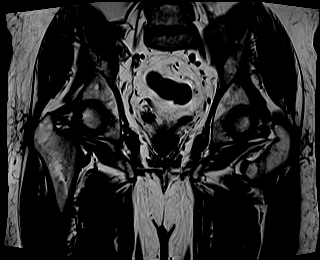
[im 25/40]
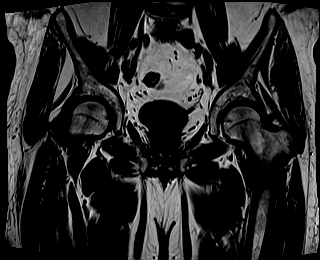
[im 30/40]
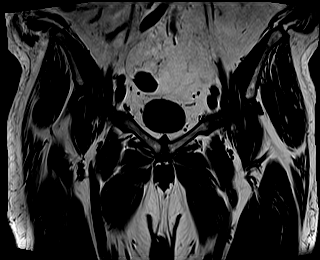
[im 35/40]
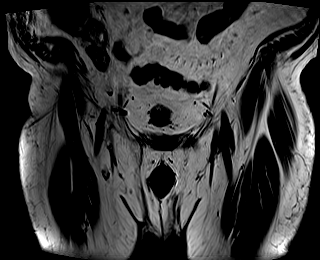
[im 40/40]
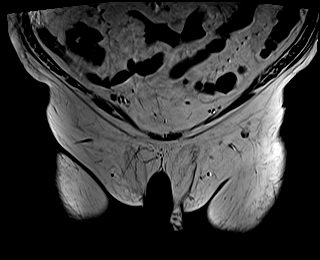

[37 of 40 positions shown; findings below may reference images not displayed]

FINDINGS: Bones: There are tiny cystic degenerative changes in the anterior
superior aspect of the right acetabulum. The bones of the pelvis and
hips otherwise are normal.

Articular cartilage and labrum

Articular cartilage:  No discrete cartilage defect.

Labrum: The superolateral and anterior aspects of the right
acetabular labrum are markedly abnormal. The labrum is hypertrophied
with abnormal high-signal. I think this represents degeneration with
intrasubstance tears.

There is also a small paralabral ganglion cyst at the
posterosuperior aspect of the acetabulum of the left hip.

Joint or bursal effusion

Joint effusion:  Trace joint effusion.

Bursae: There is inflammation of the right greater trochanteric
bursa with edema deep and superficial to the iliotibial band at the
level of the greater trochanter of the proximal right femur. No
defined fluid collection.

Muscles and tendons

Muscles and tendons:  Normal.

Other findings

Miscellaneous: Review of the prior CT scan dated 02/01/2018
demonstrates that the patient does have a focal prominent soft disc
protrusion at L4-5 to the right which could compress the right L5
and/or S1 nerves.
IMPRESSION: 1. Inflammation superficial and deep to the iliotibial band adjacent
to the right greater trochanter. No defined fluid in the bursal
space.
2. Diffuse degeneration of the superolateral and anterior aspects of
the right acetabular labrum consistent with intrasubstance tears.
3. Disc protrusion at L4-5 on the right noted on CT scan dated
02/01/2018.

## 2020-01-20 IMAGING — CR CHEST - 2 VIEW
2 series · 2 of 2 positions shown · non-contrast
Comparison: Chest radiograph dated 03/16/2018

CLINICAL DATA: 77-year-old male with shortness of breath.

EXAM:
CHEST - 2 VIEW

[chest pa]
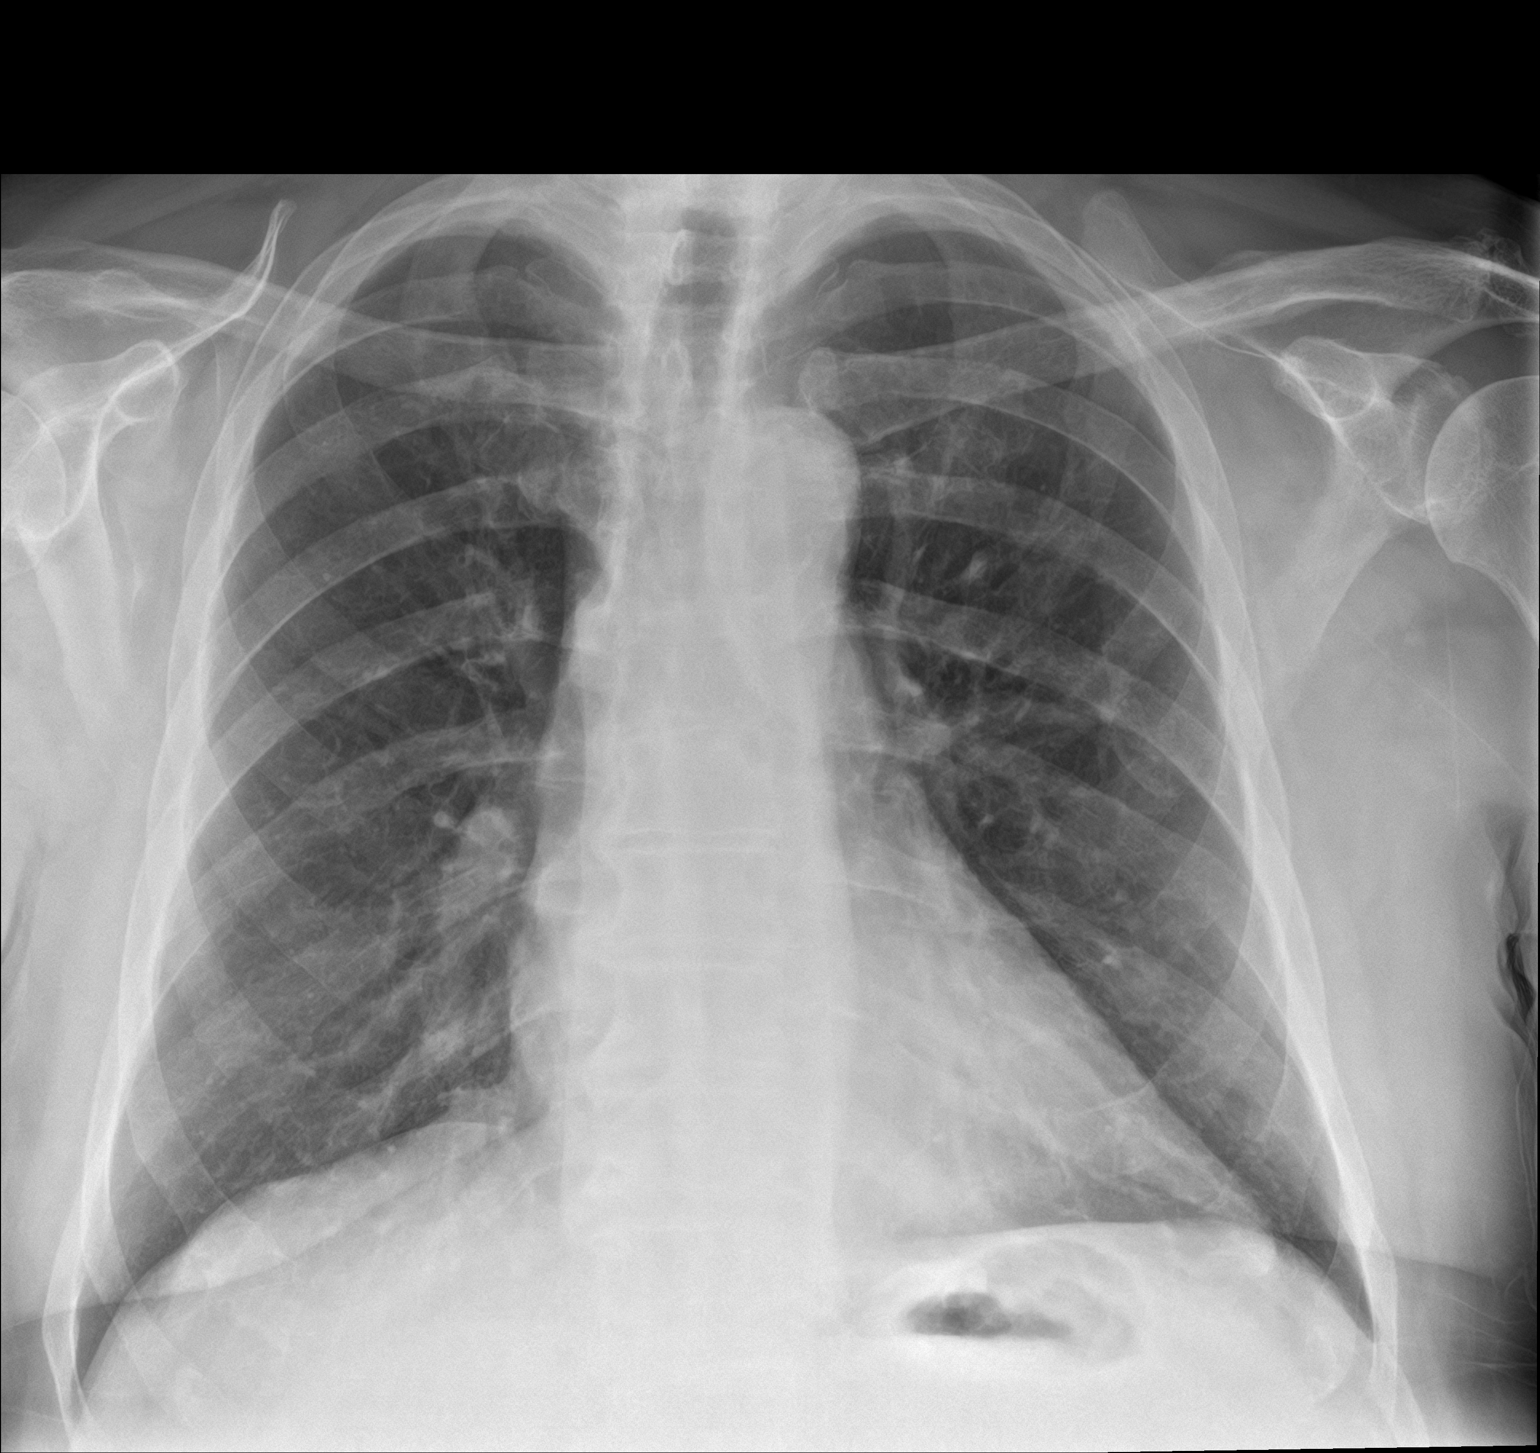

[chest lat]
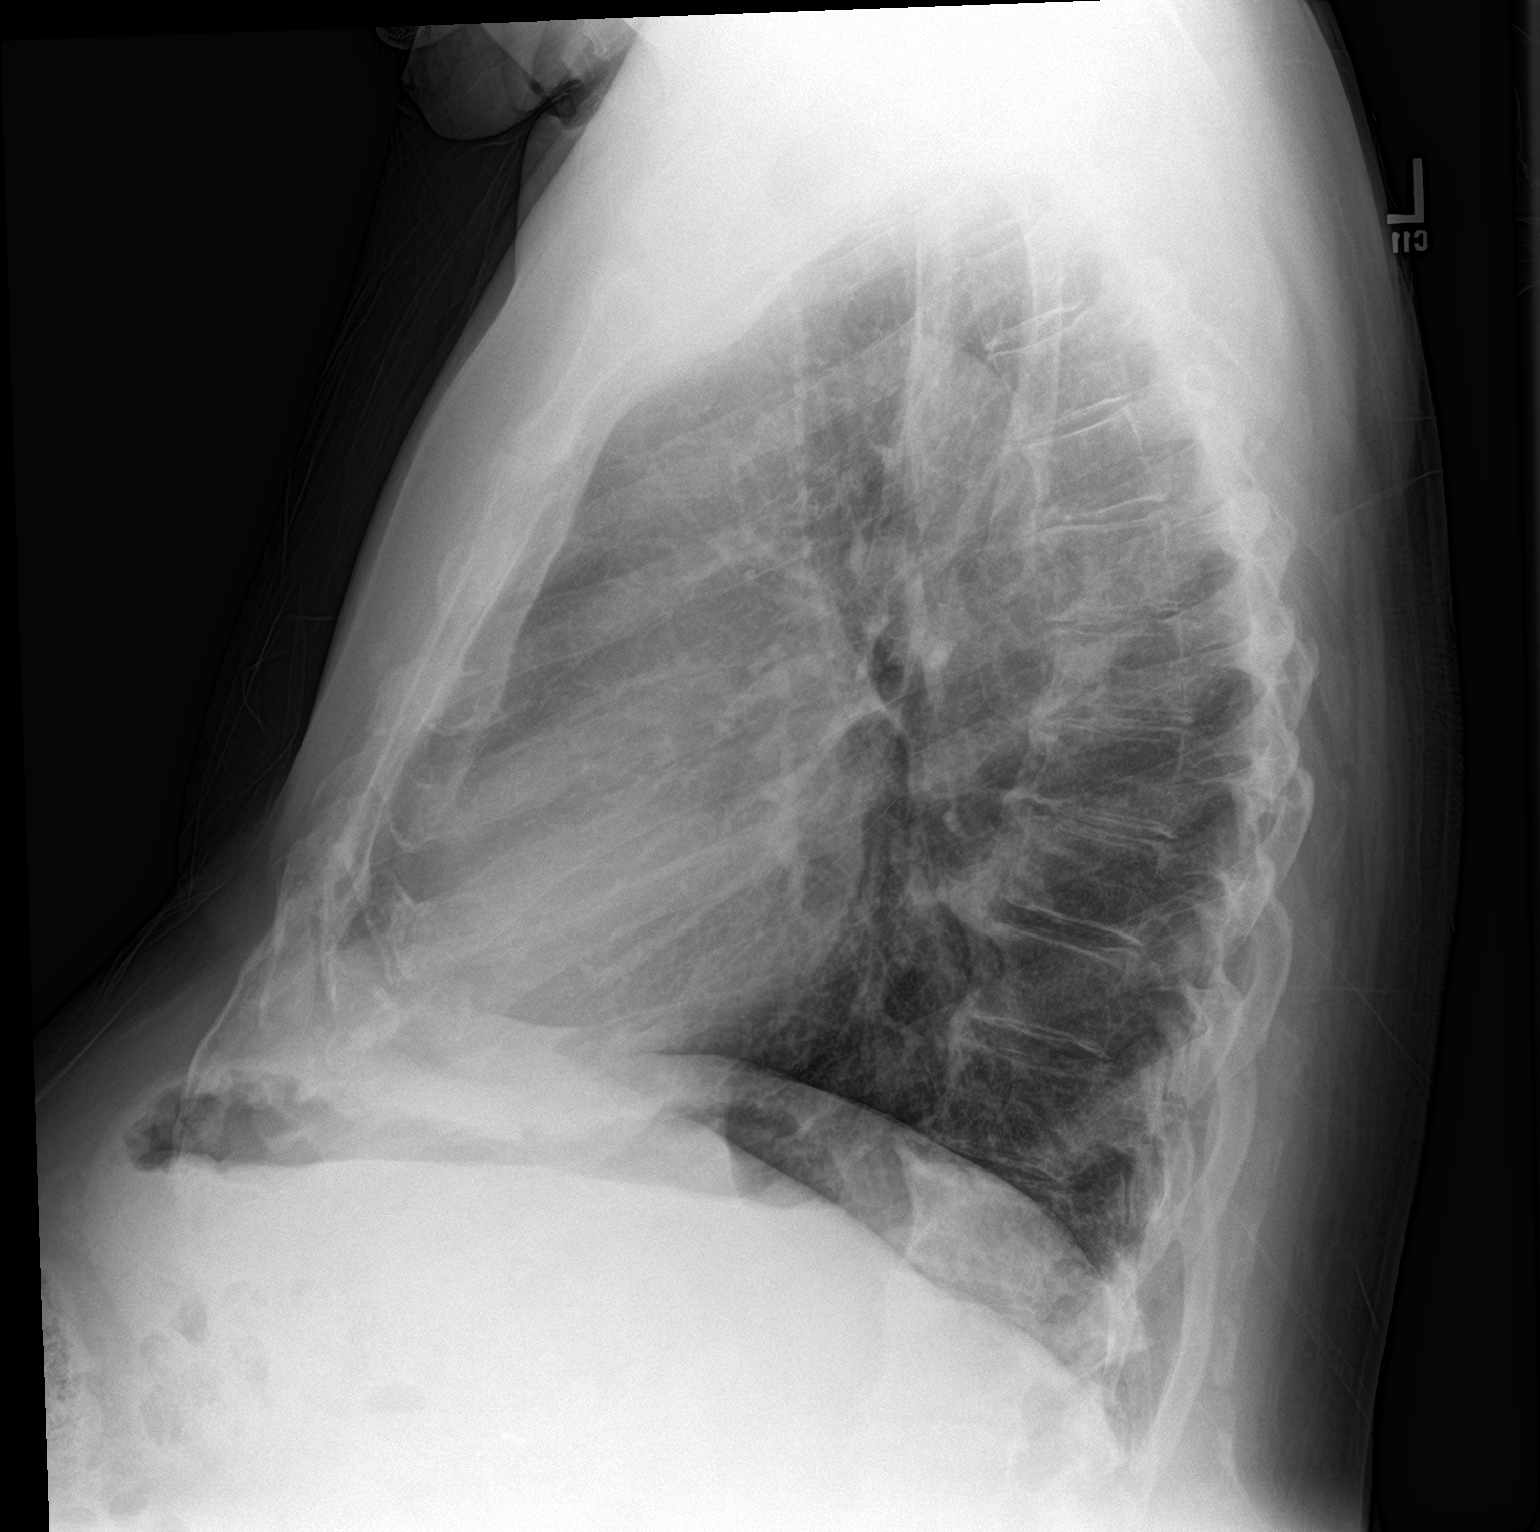

[2 of 2 positions shown; findings below may reference images not displayed]

FINDINGS: The heart size and mediastinal contours are within normal limits.
Both lungs are clear. The visualized skeletal structures are
unremarkable.
IMPRESSION: No active cardiopulmonary disease.
